# Patient Record
Sex: Female | Born: 1985
Health system: Southern US, Community
[De-identification: ages and names within clinical notes are randomized; demographics above are authoritative.]

## PROBLEM LIST (undated history)

## (undated) ENCOUNTER — Emergency Department (HOSPITAL_COMMUNITY): Payer: Medicaid Other | Source: Home / Self Care

## (undated) ENCOUNTER — Inpatient Hospital Stay (HOSPITAL_COMMUNITY): Payer: Self-pay

## (undated) DIAGNOSIS — E119 Type 2 diabetes mellitus without complications: Secondary | ICD-10-CM

## (undated) DIAGNOSIS — I8390 Asymptomatic varicose veins of unspecified lower extremity: Secondary | ICD-10-CM

## (undated) DIAGNOSIS — Z789 Other specified health status: Secondary | ICD-10-CM

## (undated) DIAGNOSIS — F419 Anxiety disorder, unspecified: Secondary | ICD-10-CM

---

## 2013-09-11 ENCOUNTER — Ambulatory Visit: Payer: Self-pay

## 2013-09-24 ENCOUNTER — Ambulatory Visit: Payer: Medicaid Other | Attending: Internal Medicine | Admitting: Internal Medicine

## 2013-09-24 ENCOUNTER — Encounter: Payer: Self-pay | Admitting: Internal Medicine

## 2013-09-24 VITALS — BP 109/68 | HR 67 | Temp 99.0°F | Resp 16 | Ht 63.0 in | Wt 175.0 lb

## 2013-09-24 DIAGNOSIS — M5380 Other specified dorsopathies, site unspecified: Secondary | ICD-10-CM

## 2013-09-24 DIAGNOSIS — Z139 Encounter for screening, unspecified: Secondary | ICD-10-CM

## 2013-09-24 DIAGNOSIS — M62830 Muscle spasm of back: Secondary | ICD-10-CM

## 2013-09-24 DIAGNOSIS — M549 Dorsalgia, unspecified: Secondary | ICD-10-CM | POA: Insufficient documentation

## 2013-09-24 DIAGNOSIS — R1032 Left lower quadrant pain: Secondary | ICD-10-CM

## 2013-09-24 LAB — COMPLETE METABOLIC PANEL WITH GFR
ALT: 10 U/L (ref 0–35)
AST: 14 U/L (ref 0–37)
Albumin: 4.3 g/dL (ref 3.5–5.2)
Alkaline Phosphatase: 83 U/L (ref 39–117)
BUN: 14 mg/dL (ref 6–23)
GFR, Est Non African American: >89 mL/min
Glucose, Bld: 90 mg/dL (ref 70–99)
Sodium: 137 mEq/L (ref 135–145)
Total Bilirubin: 0.4 mg/dL (ref 0.3–1.2)
Total Protein: 6.9 g/dL (ref 6.0–8.3)

## 2013-09-24 LAB — CBC WITH DIFFERENTIAL/PLATELET
Basophils Absolute: 0 10*3/uL (ref 0.0–0.1)
Basophils Relative: 0 % (ref 0–1)
Eosinophils Absolute: 0.1 10*3/uL (ref 0.0–0.7)
Hemoglobin: 12.9 g/dL (ref 12.0–15.0)
MCH: 29.1 pg (ref 26.0–34.0)
MCHC: 33.3 g/dL (ref 30.0–36.0)
MCV: 87.4 fL (ref 78.0–100.0)
Monocytes Relative: 5 % (ref 3–12)
Neutrophils Relative %: 41 % — ABNORMAL LOW (ref 43–77)
Platelets: 233 10*3/uL (ref 150–400)
RBC: 4.43 MIL/uL (ref 3.87–5.11)

## 2013-09-24 MED ORDER — CYCLOBENZAPRINE HCL 10 MG PO TABS: 10.0000 mg | ORAL_TABLET | Freq: Every day | ORAL | Status: DC

## 2013-09-24 MED ORDER — NAPROXEN 500 MG PO TABS: 500.0000 mg | ORAL_TABLET | Freq: Two times a day (BID) | ORAL | Status: DC

## 2013-09-24 NOTE — Progress Notes (Signed)
Pt is here to establish care. Pt reports that for over a year she has had severe pain in her legs, back and kidneys.  Pt needed the translator phone today.

## 2013-09-24 NOTE — Progress Notes (Signed)
MRN: [id number] Name: Penny Barber  Sex: female Age: 27 y.o. DOB: 03/07/1986  Allergies: Review of patient's allergies indicates no known allergies.  Chief Complaint  Patient presents with  . Establish Care    HPI: Patient is 27 y.o. female who comes for the first time to establish medical care patient reported to have left lower back pain for the last one year she has not taken any medication denies any fever chills, she had fallen several years ago, she denies any urinary symptoms, patient also reported to have left lower abdominal pain denies any nausea vomiting any change in bowel habits.  No past medical history on file.  No past surgical history on file.    Medication List       This list is accurate as of: 09/24/13  5:19 PM.  Always use your most recent med list.               cyclobenzaprine 10 MG tablet  Commonly known as:  FLEXERIL  Take 1 tablet (10 mg total) by mouth at bedtime.     naproxen 500 MG tablet  Commonly known as:  NAPROSYN  Take 1 tablet (500 mg total) by mouth 2 (two) times daily with a meal.        Meds ordered this encounter  Medications  . cyclobenzaprine (FLEXERIL) 10 MG tablet    Sig: Take 1 tablet (10 mg total) by mouth at bedtime.    Dispense:  30 tablet    Refill:  1  . naproxen (NAPROSYN) 500 MG tablet    Sig: Take 1 tablet (500 mg total) by mouth 2 (two) times daily with a meal.    Dispense:  30 tablet    Refill:  2     There is no immunization history on file for this patient.  No family history on file.  History  Substance Use Topics  . Smoking status: Never Smoker   . Smokeless tobacco: Not on file  . Alcohol Use: No    Review of Systems  As noted in HPI  Filed Vitals:   09/24/13 1628  BP: 109/68  Pulse: 67  Temp: 99 F (37.2 C)  Resp: 16    Physical Exam  Physical Exam  Constitutional: No distress.  Eyes: EOM are normal. Pupils are equal, round, and reactive to light.  Cardiovascular: Normal rate  and regular rhythm.   Pulmonary/Chest: Breath sounds normal. No respiratory distress. She has no wheezes.  Abdominal: Bowel sounds are normal.  Left lower quadrant tenderness no rebound or guarding   Musculoskeletal:  Lower lumbar spinal and left paraspinal tenderness, with SLR test pt complains of muscle pull in the back no shooting pain down to the leg,  Equal strength both lower extremities     CBC No results found for this basename: wbc,  rbc,  hgb,  hct,  plt,  mcv,  neutrabs,  lymphsabs,  monoabs,  eosabs,  basosabs    CMP  No results found for this basename: na,  k,  cl,  co2,  glucose,  bun,  creatinine,  calcium,  prot,  albumin,  ast,  alt,  alkphos,  bilitot,  gfrnonaa,  gfraa    No results found for this basename: chol,  tri,  ldl    No components found with this basename: hga1c    No results found for this basename: AST    Assessment and Plan  Back pain - Plan: naproxen (NAPROSYN) 500 MG tablet  Back muscle  spasm - Plan: cyclobenzaprine (FLEXERIL) 10 MG tablet each bedtime, also apply heating pad.  Abdominal pain, left lower quadrant - Plan: US Abdomen Complete  Screening - Plan: CBC with Differential, COMPLETE METABOLIC PANEL WITH GFR, TSH, Vit D  25 hydroxy (rtn osteoporosis monitoring)   Return in about 6 weeks (around 11/05/2013).  Doris Cheadle, MD

## 2013-09-25 LAB — VITAMIN D 25 HYDROXY (VIT D DEFICIENCY, FRACTURES): Vit D, 25-Hydroxy: 16 ng/mL — ABNORMAL LOW (ref 30–89)

## 2013-09-25 LAB — TSH: TSH: 1.64 u[IU]/mL (ref 0.350–4.500)

## 2013-10-07 ENCOUNTER — Ambulatory Visit: Payer: Self-pay

## 2013-10-08 ENCOUNTER — Ambulatory Visit: Payer: Medicaid Other | Attending: Internal Medicine | Admitting: Internal Medicine

## 2013-10-08 ENCOUNTER — Encounter: Payer: Self-pay | Admitting: Internal Medicine

## 2013-10-08 VITALS — BP 109/71 | HR 83 | Temp 98.5°F | Resp 16 | Ht 64.0 in | Wt 179.0 lb

## 2013-10-08 DIAGNOSIS — R079 Chest pain, unspecified: Secondary | ICD-10-CM | POA: Insufficient documentation

## 2013-10-08 LAB — TROPONIN I: Troponin I: <0.01 ng/mL

## 2013-10-08 LAB — CBC WITH DIFFERENTIAL/PLATELET
Basophils Absolute: 0 10*3/uL (ref 0.0–0.1)
Basophils Relative: 1 % (ref 0–1)
Eosinophils Absolute: 0.1 10*3/uL (ref 0.0–0.7)
Eosinophils Relative: 2 % (ref 0–5)
HCT: 37.5 % (ref 36.0–46.0)
Hemoglobin: 12.8 g/dL (ref 12.0–15.0)
Lymphocytes Relative: 41 % (ref 12–46)
Lymphs Abs: 1.6 10*3/uL (ref 0.7–4.0)
MCH: 29.3 pg (ref 26.0–34.0)
MCHC: 34.1 g/dL (ref 30.0–36.0)
MCV: 85.8 fL (ref 78.0–100.0)
Monocytes Absolute: 0.2 10*3/uL (ref 0.1–1.0)
Monocytes Relative: 6 % (ref 3–12)
Neutro Abs: 1.9 10*3/uL (ref 1.7–7.7)
Neutrophils Relative %: 50 % (ref 43–77)
Platelets: 200 10*3/uL (ref 150–400)
RBC: 4.37 MIL/uL (ref 3.87–5.11)
RDW: 13.5 % (ref 11.5–15.5)
WBC: 3.7 10*3/uL — ABNORMAL LOW (ref 4.0–10.5)

## 2013-10-08 LAB — TSH: TSH: 1.903 u[IU]/mL (ref 0.350–4.500)

## 2013-10-08 LAB — COMPREHENSIVE METABOLIC PANEL WITH GFR
ALT: 8 U/L (ref 0–35)
AST: 14 U/L (ref 0–37)
Albumin: 4 g/dL (ref 3.5–5.2)
Alkaline Phosphatase: 82 U/L (ref 39–117)
BUN: 15 mg/dL (ref 6–23)
CO2: 23 meq/L (ref 19–32)
Calcium: 8.5 mg/dL (ref 8.4–10.5)
Chloride: 105 meq/L (ref 96–112)
Creat: 0.57 mg/dL (ref 0.50–1.10)
Glucose, Bld: 102 mg/dL — ABNORMAL HIGH (ref 70–99)
Potassium: 4 meq/L (ref 3.5–5.3)
Sodium: 137 meq/L (ref 135–145)
Total Bilirubin: 0.6 mg/dL (ref 0.3–1.2)
Total Protein: 7 g/dL (ref 6.0–8.3)

## 2013-10-08 LAB — LIPID PANEL
Cholesterol: 174 mg/dL (ref 0–200)
HDL: 61 mg/dL
LDL Cholesterol: 96 mg/dL (ref 0–99)
Total CHOL/HDL Ratio: 2.9 ratio
Triglycerides: 87 mg/dL
VLDL: 17 mg/dL (ref 0–40)

## 2013-10-08 NOTE — Progress Notes (Signed)
Pt reports having chest pain. Pt is requesting lab and x ray results.

## 2013-10-08 NOTE — Progress Notes (Signed)
Patient ID: Penny Barber, female   DOB: 1986-08-30, 27 y.o.   MRN: [id number]   CC: Chest pain  HPI: Patient is 27 year old female who presents to clinic with main concern of intermittent episodes of substernal chest discomfort associated with shortness of breath. Patient explains these episodes are sudden and usually come at the time of stress. She explains when these episodes occur its lasting anywhere from 10-15 minutes and spontaneously resolve. She does not report any radiating symptoms, pain is rated as 3/10 in severity when present, no specific alleviating or aggravating factors. She denies any family of heart problems, no sudden cardiac death in the family. She denies fevers and chills, no cough, no orthopnea, no similar events in the past. She explains she immigrated to the [place] 4 months ago and was in a refugee [place] where these symptoms started. Please note the interpreter was used during the interview  No Known Allergies History reviewed. No pertinent past medical history. No current outpatient prescriptions on file prior to visit.   No current facility-administered medications on file prior to visit.   History reviewed. No pertinent family history. History   Social History  . Marital Status: Married    Spouse Name: N/A    Number of Children: N/A  . Years of Education: N/A   Occupational History  . Not on file.   Social History Main Topics  . Smoking status: Never Smoker   . Smokeless tobacco: Not on file  . Alcohol Use: No  . Drug Use: No  . Sexual Activity: Yes   Other Topics Concern  . Not on file   Social History Narrative  . No narrative on file    Review of Systems  Constitutional: Negative for fever, chills, diaphoresis, activity change, appetite change and fatigue.  HENT: Negative for ear pain, nosebleeds, congestion, facial swelling, rhinorrhea, neck pain, neck stiffness and ear discharge.   Eyes: Negative for pain, discharge, redness, itching  and visual disturbance.  Respiratory: Negative for cough, choking, chest tightness, shortness of breath, wheezing and stridor.   Cardiovascular: Negative for chest pain, palpitations and leg swelling.  Gastrointestinal: Negative for abdominal distention.  Genitourinary: Negative for dysuria, urgency, frequency, hematuria, flank pain, decreased urine volume, difficulty urinating and dyspareunia.  Musculoskeletal: Negative for back pain, joint swelling, arthralgias and gait problem.  Neurological: Negative for dizziness, tremors, seizures, syncope, facial asymmetry, speech difficulty, weakness, light-headedness, numbness and headaches.  Hematological: Negative for adenopathy. Does not bruise/bleed easily.  Psychiatric/Behavioral: Negative for hallucinations, behavioral problems, confusion, dysphoric mood, decreased concentration and agitation.    Objective:   Filed Vitals:   10/08/13 1056  BP: 109/71  Pulse: 83  Temp: 98.5 F (36.9 C)  Resp: 16    Physical Exam  Constitutional: Appears well-developed and well-nourished. No distress.  HENT: Normocephalic. External right and left ear normal. Oropharynx is clear and moist.  Eyes: Conjunctivae and EOM are normal. PERRLA, no scleral icterus.  Neck: Normal ROM. Neck supple. No JVD. No tracheal deviation. No thyromegaly.  CVS: RRR, S1/S2 +, no murmurs, no gallops, no carotid bruit.  Pulmonary: Effort and breath sounds normal, no stridor, rhonchi, wheezes, rales.  Abdominal: Soft. BS +,  no distension, tenderness, rebound or guarding.  Musculoskeletal: Normal range of motion. No edema and no tenderness.  Lymphadenopathy: No lymphadenopathy noted, cervical, inguinal. Neuro: Alert. Normal reflexes, muscle tone coordination. No cranial nerve deficit. Skin: Skin is warm and dry. No rash noted. Not diaphoretic. No erythema. No pallor.  Psychiatric: Normal mood  and affect. Behavior, judgment, thought content normal.   No results found for this  basename: WBC, HGB, HCT, MCV, PLT   No results found for this basename: CREATININE, BUN, NA, K, CL, CO2    No results found for this basename: HGBA1C   Lipid Panel  No results found for this basename: chol, trig, hdl, cholhdl, vldl, ldlcalc       Assessment and plan:   Chest pain - this is likely related to anxiety attacks. We have discussed methods of relaxation. We will check blood tests on today's visit including troponin and electrolyte panel. We'll also check TSH and will call patient with results. Please note that her heart and lung exams are both stable and within normal limits.

## 2013-10-10 ENCOUNTER — Encounter: Payer: Self-pay | Admitting: Internal Medicine

## 2013-11-05 ENCOUNTER — Encounter: Payer: Self-pay | Admitting: Internal Medicine

## 2013-11-05 ENCOUNTER — Ambulatory Visit: Payer: Medicaid Other | Attending: Internal Medicine | Admitting: Internal Medicine

## 2013-11-05 VITALS — BP 110/73 | HR 80 | Temp 98.1°F | Resp 16 | Ht 64.0 in | Wt 178.0 lb

## 2013-11-05 DIAGNOSIS — M5380 Other specified dorsopathies, site unspecified: Secondary | ICD-10-CM

## 2013-11-05 DIAGNOSIS — M549 Dorsalgia, unspecified: Secondary | ICD-10-CM

## 2013-11-05 DIAGNOSIS — R109 Unspecified abdominal pain: Secondary | ICD-10-CM

## 2013-11-05 DIAGNOSIS — S335XXA Sprain of ligaments of lumbar spine, initial encounter: Secondary | ICD-10-CM | POA: Insufficient documentation

## 2013-11-05 DIAGNOSIS — M62830 Muscle spasm of back: Secondary | ICD-10-CM

## 2013-11-05 DIAGNOSIS — X58XXXA Exposure to other specified factors, initial encounter: Secondary | ICD-10-CM | POA: Insufficient documentation

## 2013-11-05 LAB — POCT URINALYSIS DIPSTICK
Bilirubin, UA: NEGATIVE
GLUCOSE UA: NEGATIVE
KETONES UA: NEGATIVE
Leukocytes, UA: NEGATIVE
Nitrite, UA: NEGATIVE
PH UA: 6
Protein, UA: NEGATIVE
RBC UA: NEGATIVE
Spec Grav, UA: >=1.03
Urobilinogen, UA: 0.2

## 2013-11-05 LAB — POCT URINE PREGNANCY: Preg Test, Ur: NEGATIVE

## 2013-11-05 MED ORDER — NAPROXEN 500 MG PO TABS: 500.0000 mg | ORAL_TABLET | Freq: Two times a day (BID) | ORAL | Status: DC

## 2013-11-05 MED ORDER — ERGOCALCIFEROL 1.25 MG (50000 UT) PO CAPS: 50000.0000 [IU] | ORAL_CAPSULE | ORAL | Status: DC

## 2013-11-05 MED ORDER — CYCLOBENZAPRINE HCL 10 MG PO TABS: 10.0000 mg | ORAL_TABLET | Freq: Every day | ORAL | Status: DC

## 2013-11-05 NOTE — Progress Notes (Signed)
Pt is here with C.C. Of left flank pain. Pt is requesting to review her lab results.

## 2013-11-05 NOTE — Progress Notes (Signed)
Patient ID: Penny Barber, female   DOB: [birth year], 28 y.o.   MRN: [id number]   CC:  HPI: 28 year old female with a history of left flank pain, low back pain for which he is taking Flexeril and Naprosyn in the past, here for a followup. The back pain has returned because the patient has run out of her medications. She recently moved to the [place] 4 months ago. She has not had any imaging studies. She also requests results of her blood work, notified of the patient's vitamin D level is low She denies any stool or urinary incontinence any numbness or tingling in her legs She is due for a pelvic ultrasound machine which is scheduled for February  No Known Allergies History reviewed. No pertinent past medical history. No current outpatient prescriptions on file prior to visit.   No current facility-administered medications on file prior to visit.   History reviewed. No pertinent family history. History   Social History  . Marital Status: Married    Spouse Name: N/A    Number of Children: N/A  . Years of Education: N/A   Occupational History  . Not on file.   Social History Main Topics  . Smoking status: Never Smoker   . Smokeless tobacco: Not on file  . Alcohol Use: No  . Drug Use: No  . Sexual Activity: Yes   Other Topics Concern  . Not on file   Social History Narrative   ** Merged History Encounter **        Review of Systems  Constitutional: Negative for fever, chills, diaphoresis, activity change, appetite change and fatigue.  HENT: Negative for ear pain, nosebleeds, congestion, facial swelling, rhinorrhea, neck pain, neck stiffness and ear discharge.   Eyes: Negative for pain, discharge, redness, itching and visual disturbance.  Respiratory: Negative for cough, choking, chest tightness, shortness of breath, wheezing and stridor.   Cardiovascular: Negative for chest pain, palpitations and leg swelling.  Gastrointestinal: Negative for abdominal distention.   Genitourinary: Negative for dysuria, urgency, frequency, hematuria, flank pain, decreased urine volume, difficulty urinating and dyspareunia.  Musculoskeletal: Negative for back pain, joint swelling, arthralgias and gait problem.  Neurological: Negative for dizziness, tremors, seizures, syncope, facial asymmetry, speech difficulty, weakness, light-headedness, numbness and headaches.  Hematological: Negative for adenopathy. Does not bruise/bleed easily.  Psychiatric/Behavioral: Negative for hallucinations, behavioral problems, confusion, dysphoric mood, decreased concentration and agitation.    Objective:   Filed Vitals:   11/05/13 1528  BP: 110/73  Pulse: 80  Temp: 98.1 F (36.7 C)  Resp: 16    Physical Exam  Constitutional: Appears well-developed and well-nourished. No distress.  HENT: Normocephalic. External right and left ear normal. Oropharynx is clear and moist.  Eyes: Conjunctivae and EOM are normal. PERRLA, no scleral icterus.  Neck: Normal ROM. Neck supple. No JVD. No tracheal deviation. No thyromegaly.  CVS: RRR, S1/S2 +, no murmurs, no gallops, no carotid bruit.  Pulmonary: Effort and breath sounds normal, no stridor, rhonchi, wheezes, rales.  Abdominal: Soft. BS +,  no distension, tenderness, rebound or guarding.  Musculoskeletal: Normal range of motion. No edema and positive for tenderness lumbar spine.  Lymphadenopathy: No lymphadenopathy noted, cervical, inguinal. Neuro: Alert. Normal reflexes, muscle tone coordination. No cranial nerve deficit. Skin: Skin is warm and dry. No rash noted. Not diaphoretic. No erythema. No pallor.  Psychiatric: Normal mood and affect. Behavior, judgment, thought content normal.   Lab Results  Component Value Date   WBC 3.7* 10/08/2013   HGB 12.8 10/08/2013  HCT 37.5 10/08/2013   MCV 85.8 10/08/2013   PLT 200 10/08/2013   Lab Results  Component Value Date   CREATININE 0.57 10/08/2013   BUN 15 10/08/2013   NA 137 10/08/2013    K 4.0 10/08/2013   CL 105 10/08/2013   CO2 23 10/08/2013    No results found for this basename: HGBA1C   Lipid Panel     Component Value Date/Time   CHOL 174 10/08/2013 1142   TRIG 87 10/08/2013 1142   HDL 61 10/08/2013 1142   CHOLHDL 2.9 10/08/2013 1142   VLDL 17 10/08/2013 1142   LDLCALC 96 10/08/2013 1142       Assessment and plan:   Patient Active Problem List   Diagnosis Date Noted  . Back pain 09/24/2013  . Abdominal pain, left lower quadrant 09/24/2013     Lumbar sprain Refill Flexeril/Naprosyn Plain x-rays Pelvic ultrasound pending   Follow up in 2 months   Vitamin D deficiency Vitamin D 50,000 units, repeat levels in 2 months   The patient was given clear instructions to go to ER or return to medical center if symptoms don't improve, worsen or new problems develop. The patient verbalized understanding. The patient was told to call to get any lab results if not heard anything in the next week.

## 2013-11-06 ENCOUNTER — Ambulatory Visit (HOSPITAL_COMMUNITY)
Admission: RE | Admit: 2013-11-06 | Discharge: 2013-11-06 | Disposition: A | Discharge status: Home / Self Care | Payer: Medicaid Other | Source: Ambulatory Visit | Attending: Internal Medicine | Admitting: Internal Medicine

## 2013-11-06 DIAGNOSIS — R109 Unspecified abdominal pain: Secondary | ICD-10-CM

## 2013-11-06 DIAGNOSIS — M62830 Muscle spasm of back: Secondary | ICD-10-CM

## 2013-11-06 DIAGNOSIS — M549 Dorsalgia, unspecified: Secondary | ICD-10-CM | POA: Insufficient documentation

## 2013-11-06 DIAGNOSIS — R10A0 Flank pain, unspecified side: Secondary | ICD-10-CM

## 2013-11-07 ENCOUNTER — Telehealth: Payer: Self-pay | Admitting: *Deleted

## 2013-11-07 NOTE — Telephone Encounter (Signed)
Contacted pt with the interpreter line. Notified pt of a negative x-ray of the lumbar spine.

## 2013-11-07 NOTE — Telephone Encounter (Signed)
Message copied by Sindi Beckworth, [place] on Fri Nov 07, 2013  2:04 PM ------      Message from: Susie CassetteABROL MD, [facility]      Created: Thu Nov 06, 2013  2:01 PM       Negative x-ray of the sacrum or coccyx ------

## 2013-11-07 NOTE — Telephone Encounter (Signed)
Message copied by Darcee Dekker, [place] on Fri Nov 07, 2013 10:14 AM ------      Message from: Susie CassetteABROL MD, [facility]      Created: Thu Nov 06, 2013 11:05 AM       Urinalysis and pregnancy test is negative ------

## 2013-11-25 ENCOUNTER — Encounter (HOSPITAL_COMMUNITY): Payer: Self-pay | Admitting: Emergency Medicine

## 2013-11-25 ENCOUNTER — Emergency Department (HOSPITAL_COMMUNITY)
Admission: EM | Admit: 2013-11-25 | Discharge: 2013-11-26 | Disposition: A | Discharge status: Home / Self Care | Payer: Medicaid Other | Attending: Emergency Medicine | Admitting: Emergency Medicine

## 2013-11-25 DIAGNOSIS — R21 Rash and other nonspecific skin eruption: Secondary | ICD-10-CM | POA: Insufficient documentation

## 2013-11-25 DIAGNOSIS — N61 Mastitis without abscess: Secondary | ICD-10-CM | POA: Insufficient documentation

## 2013-11-25 LAB — CBC WITH DIFFERENTIAL/PLATELET
BASOS PCT: 0 % (ref 0–1)
Basophils Absolute: 0 10*3/uL (ref 0.0–0.1)
EOS ABS: 0 10*3/uL (ref 0.0–0.7)
Eosinophils Relative: 1 % (ref 0–5)
HCT: 39.8 % (ref 36.0–46.0)
HEMOGLOBIN: 13.7 g/dL (ref 12.0–15.0)
LYMPHS ABS: 2.2 10*3/uL (ref 0.7–4.0)
Lymphocytes Relative: 30 % (ref 12–46)
MCH: 30.4 pg (ref 26.0–34.0)
MCHC: 34.4 g/dL (ref 30.0–36.0)
MCV: 88.4 fL (ref 78.0–100.0)
Monocytes Absolute: 0.4 10*3/uL (ref 0.1–1.0)
Monocytes Relative: 5 % (ref 3–12)
Neutro Abs: 4.9 10*3/uL (ref 1.7–7.7)
Neutrophils Relative %: 65 % (ref 43–77)
PLATELETS: 239 10*3/uL (ref 150–400)
RBC: 4.5 MIL/uL (ref 3.87–5.11)
RDW: 11.9 % (ref 11.5–15.5)
WBC: 7.5 10*3/uL (ref 4.0–10.5)

## 2013-11-25 LAB — COMPREHENSIVE METABOLIC PANEL
ALK PHOS: 88 U/L (ref 39–117)
ALT: 12 U/L (ref 0–35)
AST: 18 U/L (ref 0–37)
Albumin: 4.1 g/dL (ref 3.5–5.2)
BILIRUBIN TOTAL: 0.3 mg/dL (ref 0.3–1.2)
BUN: 17 mg/dL (ref 6–23)
CHLORIDE: 102 meq/L (ref 96–112)
CO2: 25 mEq/L (ref 19–32)
Calcium: 9.2 mg/dL (ref 8.4–10.5)
Creatinine, Ser: 0.64 mg/dL (ref 0.50–1.10)
GFR calc Af Amer: >90 mL/min (ref >90)
GFR calc non Af Amer: >90 mL/min (ref >90)
Glucose, Bld: 121 mg/dL — ABNORMAL HIGH (ref 70–99)
Potassium: 3.6 mEq/L — ABNORMAL LOW (ref 3.7–5.3)
SODIUM: 141 meq/L (ref 137–147)
TOTAL PROTEIN: 8.1 g/dL (ref 6.0–8.3)

## 2013-11-25 LAB — POCT I-STAT TROPONIN I: Troponin i, poc: 0 ng/mL (ref 0.00–0.08)

## 2013-11-25 MED ORDER — SULFAMETHOXAZOLE-TMP DS 800-160 MG PO TABS
1.0000 | ORAL_TABLET | Freq: Once | ORAL | Status: AC
  Administered 2013-11-25: 1 via ORAL
  Filled 2013-11-25: qty 1

## 2013-11-25 MED ORDER — SODIUM CHLORIDE 0.9 % IV BOLUS (SEPSIS)
1000.0000 mL | Freq: Once | INTRAVENOUS | Status: AC
  Administered 2013-11-25: 1000 mL via INTRAVENOUS

## 2013-11-25 MED ORDER — IBUPROFEN 800 MG PO TABS
800.0000 mg | ORAL_TABLET | Freq: Once | ORAL | Status: AC
  Administered 2013-11-25: 800 mg via ORAL
  Filled 2013-11-25: qty 1

## 2013-11-25 NOTE — ED Provider Notes (Signed)
CSN: [id number]     Arrival date & time 11/25/13  1910 History   First MD Initiated Contact with Patient 11/25/13 1929     Chief Complaint  Patient presents with  . Chest Pain      HPI  28 yo F from [place] who is breastfeeding her 79 month old. She develops today with mild redness and ttp of her R breast. She denies any new drainage from the breast. No reported fevers. No trauma, CP, RUQ pain, emesis. Pain is specifically located to her R breast. Pain does not radiate. It is a 4/10 and worsens with palpation.   History reviewed. No pertinent past medical history. Past Surgical History  Procedure Laterality Date  . Cesarean section     History reviewed. No pertinent family history. History  Substance Use Topics  . Smoking status: Never Smoker   . Smokeless tobacco: Not on file  . Alcohol Use: No   OB History   Grav Para Term Preterm Abortions TAB SAB Ect Mult Living                 Review of Systems  Constitutional: Negative for fever, chills, activity change and appetite change.  HENT: Negative for congestion, ear pain and rhinorrhea.   Eyes: Negative for pain.  Respiratory: Negative for cough and shortness of breath.   Cardiovascular: Negative for chest pain and palpitations.  Gastrointestinal: Negative for nausea, vomiting and abdominal pain.  Genitourinary: Negative for dysuria, difficulty urinating and pelvic pain.  Musculoskeletal: Negative for back pain and neck pain.  Skin: Positive for rash. Negative for wound.  Neurological: Negative for weakness and headaches.  Psychiatric/Behavioral: Negative for behavioral problems, confusion and agitation.      Allergies  Review of patient's allergies indicates no known allergies.  Home Medications   No current outpatient prescriptions on file. BP 104/60  Pulse 96  Temp(Src) 98.4 F (36.9 C) (Oral)  Resp 18  Ht 5\' 5"  (1.651 m)  Wt 179 lb (81.194 kg)  BMI 29.79 kg/m2  SpO2 98% Physical Exam  Constitutional:  She is oriented to person, place, and time. She appears well-developed and well-nourished. No distress.  HENT:  Head: Normocephalic and atraumatic.  Nose: Nose normal.  Mouth/Throat: Oropharynx is clear and moist.  Eyes: EOM are normal. Pupils are equal, round, and reactive to light.  Neck: Normal range of motion. Neck supple. No tracheal deviation present.  Cardiovascular: Normal rate, regular rhythm, normal heart sounds and intact distal pulses.   Pulmonary/Chest: Effort normal and breath sounds normal. She has no rales.  Abdominal: Soft. Bowel sounds are normal. She exhibits no distension. There is no tenderness. There is no rebound and no guarding.  Musculoskeletal: Normal range of motion. She exhibits no tenderness.  Neurological: She is alert and oriented to person, place, and time.  Skin: Skin is warm and dry. Rash noted.  Breast area with no fluctuance, no drainage. Mild erythema and TTP on exam. Bedside US without obvious collection.  Some ducts appear engorged.   Psychiatric: She has a normal mood and affect. Her behavior is normal.    ED Course  Procedures (including critical care time) Labs Review Labs Reviewed  COMPREHENSIVE METABOLIC PANEL - Abnormal; Notable for the following:    Potassium 3.6 (*)    Glucose, Bld 121 (*)    All other components within normal limits  CBC WITH DIFFERENTIAL  POCT I-STAT TROPONIN I   Imaging Review No results found.  EKG Interpretation   None  MDM   Final diagnoses:  Mastitis, acute    28 yo F in NAD AFVSS non toxic appearing. Presents with concern for mastitis. No fluctuance doubt abscess. Interpreter used for ED encounter. Will start on bactrim. Explained that she should avoid breastfeeding while on the bactrim but should express her breast milk during this time. Thorough discussion with patient on plan, findings and return  Precautions. F/u with pcp in 2 days or sooner in ED if worsening or any concerns.   12:24 AM Pt  developed nausea after bactrim. Will change to Augmentin. Explained there is a chance this will not treat the infection if it is MRSA. Used interpreter to explain she must not breast feed during abx course. Will follow up with PCP tomorrow.   Case discussed with Dr. Jodi MourningZavitz.      Nadara MustardJulio Cassidie Veiga, MD 11/26/13 [phone]

## 2013-11-25 NOTE — ED Notes (Addendum)
Pt states that her pain can be pinpointed to her right breast. Pt also states that the pain radiates through her right arm, into her right side abdomen and in her shoulders. Pt states that pain started today. Pt denies doing anything when the pain started.

## 2013-11-25 NOTE — ED Notes (Signed)
Assisted resident with examination, asked to placed a hot pack onto patients right breast. Hot pack placed on pt's right breast.

## 2013-11-26 MED ORDER — SULFAMETHOXAZOLE-TMP DS 800-160 MG PO TABS: 1.0000 | ORAL_TABLET | Freq: Two times a day (BID) | ORAL | Status: DC

## 2013-11-26 MED ORDER — AMOXICILLIN-POT CLAVULANATE 875-125 MG PO TABS: 1.0000 | ORAL_TABLET | Freq: Two times a day (BID) | ORAL | Status: DC

## 2013-11-26 NOTE — ED Notes (Signed)
Dr. Roda ShuttersArriola ( resident MD ) at bedside explaining discharge instructions to pt. using interpreter ( [place]) phone . Prescription for mastitis given to pt.

## 2013-11-26 NOTE — ED Provider Notes (Addendum)
Medical screening examination/treatment/procedure(s) were conducted as a shared visit with non-physician practitioner(s) or resident and myself. I personally evaluated the patient during the encounter and agree with the findings and plan unless otherwise indicated.  I have personally reviewed any xrays and/ or EKG's with the provider and I agree with interpretation.  Breast feeding, right mid breast tenderness. On exam no fluctuance, tender, mild warmth, no drainage.  Well appearing otherwise. Concern for mastitis. Plan for abx and close fup outpt. Abdo soft/ NT.  Mastitis, Right breast pain  EMERGENCY DEPARTMENT US SOFT TISSUE INTERPRETATION "Study: Limited Soft Tissue Ultrasound"  INDICATIONS: Pain and Soft tissue infection Multiple views of the body part were obtained in real-time with a multi-frequency linear probe PERFORMED BY:  Myself IMAGES ARCHIVED?: Yes SIDE:Right  BODY PART:Breast FINDINGS: No abcess noted INTERPRETATION:  No abcess noted     Enid SkeensJoshua M Cason Luffman, MD 11/26/13 0230  Enid SkeensJoshua M Dolores Ewing, MD 12/04/13 [phone]

## 2013-11-26 NOTE — Discharge Instructions (Signed)
Breastfeeding and Mastitis °Mastitis is inflammation of the breast tissue. It can occur in women who are breastfeeding. This can make breastfeeding painful. Mastitis will sometimes go away on its own. Your health care provider will help determine if treatment is needed. °CAUSES °Mastitis is often associated with a blocked milk (lactiferous) duct. This can happen when too much milk builds up in the breast. Causes of excess milk in the breast can include: °· Poor latch-on. If your baby is not latched onto the breast properly, she or he may not empty your breast completely while breastfeeding. °· Allowing too much time to pass between feedings. °· Wearing a bra or other clothing that is too tight. This puts extra pressure on the lactiferous ducts so milk does not flow through them as it should. °Mastitis can also be caused by a bacterial infection. Bacteria may enter the breast tissue through cuts or openings in the skin. In women who are breastfeeding, this may occur because of cracked or irritated skin. Cracks in the skin are often caused when your baby does not latch on properly to the breast. °SIGNS AND SYMPTOMS °· Swelling, redness, tenderness, and pain in an area of the breast. °· Swelling of the glands under the arm on the same side. °· Fever may or may not accompany mastitis. °If an infection is allowed to progress, a collection of pus (abscess) may develop. °DIAGNOSIS  °Your health care provider can usually diagnose mastitis based on your symptoms and a physical exam. Tests may be done to help confirm the diagnosis. These may include: °· Removal of pus from the breast by applying pressure to the area. This pus can be examined in the lab to determine which bacteria are present. If an abscess has developed, the fluid in the abscess can be removed with a needle. This can also be used to confirm the diagnosis and determine the bacteria present. In most cases, pus will not be present. °· Blood tests to determine if  your body is fighting a bacterial infection. °· Mammogram or ultrasound tests to rule out other problems or diseases. °TREATMENT  °Mastitis that occurs with breastfeeding will sometimes go away on its own. Your health care provider may choose to wait 24 hours after first seeing you to decide whether a prescription medicine is needed. If your symptoms are worse after 24 hours, your health care provider will likely prescribe an antibiotic to treat the mastitis. He or she will determine which bacteria are most likely causing the infection and will then select an appropriate antibiotic. This is sometimes changed based on the results of tests performed to identify the bacteria, or if there is no response to the antibiotic selected. Antibiotics are usually given by mouth. You may also be given medicine for pain. °HOME CARE INSTRUCTIONS °· Only take over-the-counter or prescription medicines for pain, fever, or discomfort as directed by your health care provider. °· If your health care provider prescribed an antibiotic, take the medicine as directed. Make sure you finish it even if you start to feel better. °· Do not wear a tight or underwire bra. Wear a soft, supportive bra. °· Increase your fluid intake, especially if you have a fever. °· Continue to empty the breast. Your health care provider can tell you whether this milk is safe for your infant or needs to be thrown out. You may be told to stop nursing until your health care provider thinks it is safe for your baby. Use a breast pump if   you are advised to stop nursing.  Keep your nipples clean and dry.  Empty the first breast completely before going to the other breast. If your baby is not emptying your breasts completely for some reason, use a breast pump to empty your breasts.  If you go back to work, pump your breasts while at work to stay in time with your nursing schedule.  Avoid allowing your breasts to become overly filled with milk (engorged). SEEK  MEDICAL CARE IF:  You have pus-like discharge from the breast.  Your symptoms do not improve with the treatment prescribed by your health care provider within 2 days. SEEK IMMEDIATE MEDICAL CARE IF:  Your pain and swelling are getting worse.  You have pain that is not controlled with medicine.  You have a red line extending from the breast toward your armpit.  You have a fever or persistent symptoms for more than 2 3 days.  You have a fever and your symptoms suddenly get worse. MAKE SURE YOU:   Understand these instructions.  Will watch your condition.  Will get help right away if you are not doing well or get worse. Document Released: 01/20/2005 Document Revised: 05/28/2013 Document Reviewed: 05/01/2013 [facility] Patient Information 2014 [place], [place].  Cellulitis Cellulitis is an infection of the skin and the tissue under the skin. The infected area is usually red and tender. This happens most often in the arms and lower legs. HOME CARE   Take your antibiotic medicine as told. Finish the medicine even if you start to feel better.  Keep the infected arm or leg raised (elevated).  Put a warm cloth on the area up to 4 times per day.  Only take medicines as told by your doctor.  Keep all doctor visits as told. GET HELP RIGHT AWAY IF:   You have a fever.  You feel very sleepy.  You throw up (vomit) or have watery poop (diarrhea).  You feel sick and have muscle aches and pains.  You see red streaks on the skin coming from the infected area.  Your red area gets bigger or turns a dark color.  Your bone or joint under the infected area is painful after the skin heals.  Your infection comes back in the same area or different area.  You have a puffy (swollen) bump in the infected area.  You have new symptoms. MAKE SURE YOU:   Understand these instructions.  Will watch your condition.  Will get help right away if you are not doing well or get worse. Document  Released: 03/13/2008 Document Revised: 03/26/2012 Document Reviewed: 12/11/2011 [facility]Exit[facility] Patient Information 2014 [place], [place].

## 2013-11-27 ENCOUNTER — Encounter (HOSPITAL_BASED_OUTPATIENT_CLINIC_OR_DEPARTMENT_OTHER): Payer: Self-pay | Admitting: Emergency Medicine

## 2013-11-27 ENCOUNTER — Emergency Department (HOSPITAL_BASED_OUTPATIENT_CLINIC_OR_DEPARTMENT_OTHER)
Admission: EM | Admit: 2013-11-27 | Discharge: 2013-11-27 | Disposition: A | Discharge status: Home / Self Care | Payer: Medicaid Other | Attending: Emergency Medicine | Admitting: Emergency Medicine

## 2013-11-27 DIAGNOSIS — Z792 Long term (current) use of antibiotics: Secondary | ICD-10-CM | POA: Insufficient documentation

## 2013-11-27 DIAGNOSIS — N61 Mastitis without abscess: Secondary | ICD-10-CM | POA: Insufficient documentation

## 2013-11-27 MED ORDER — ONDANSETRON HCL 4 MG/2ML IJ SOLN
4.0000 mg | Freq: Once | INTRAMUSCULAR | Status: AC
  Administered 2013-11-27: 4 mg via INTRAVENOUS
  Filled 2013-11-27: qty 2

## 2013-11-27 MED ORDER — CLINDAMYCIN PHOSPHATE 600 MG/50ML IV SOLN
600.0000 mg | Freq: Once | INTRAVENOUS | Status: AC
  Administered 2013-11-27: 600 mg via INTRAVENOUS
  Filled 2013-11-27: qty 50

## 2013-11-27 MED ORDER — KETOROLAC TROMETHAMINE 30 MG/ML IJ SOLN
30.0000 mg | Freq: Once | INTRAMUSCULAR | Status: AC
  Administered 2013-11-27: 30 mg via INTRAVENOUS
  Filled 2013-11-27: qty 1

## 2013-11-27 MED ORDER — SODIUM CHLORIDE 0.9 % IV SOLN
INTRAVENOUS | Status: DC
  Administered 2013-11-27: 1000 mL via INTRAVENOUS

## 2013-11-27 NOTE — ED Provider Notes (Signed)
CSN: [id number]     Arrival date & time 11/27/13  0007 History   First MD Initiated Contact with Patient 11/27/13 0101     Chief Complaint  Patient presents with  . Breast Pain     (Consider location/radiation/quality/duration/timing/severity/associated sxs/prior Treatment) HPI This is a 28 year old female who was evaluated on the 17th of this month for breast pain. She is currently nursing. She was diagnosed with right mastitis and placed initially on Bactrim but due to nausea this was switched to Augmentin. She returns complaining of generalized malaise, body aches, headache, low-grade fever and continued pain in the right breast. She has not been taking any acetaminophen or NSAIDs for her symptoms. She has had nausea but no diarrhea. She wishes to be tested for typhoid and malaria.  History reviewed. No pertinent past medical history. Past Surgical History  Procedure Laterality Date  . Cesarean section     No family history on file. History  Substance Use Topics  . Smoking status: Never Smoker   . Smokeless tobacco: Not on file  . Alcohol Use: No   OB History   Grav Para Term Preterm Abortions TAB SAB Ect Mult Living                 Review of Systems  All other systems reviewed and are negative.      Allergies  Review of patient's allergies indicates no known allergies.  Home Medications   Current Outpatient Rx  Name  Route  Sig  Dispense  Refill  . amoxicillin-clavulanate (AUGMENTIN) 875-125 MG per tablet   Oral   Take 1 tablet by mouth every 12 (twelve) hours.   14 tablet   0    BP 109/67  Pulse 104  Temp(Src) 99.5 F (37.5 C) (Oral)  Resp 18  SpO2 99%  Physical Exam General: Well-developed, well-nourished female in no acute distress; appearance consistent with age of record HENT: normocephalic; atraumatic Eyes: pupils equal, round and reactive to light; extraocular muscles intact Neck: supple Heart: regular rate and rhythm; tachycardia Lungs:  clear to auscultation bilaterally Chest: Erythema and tenderness of the right breast inferiorly and medial to the areola without fluctuance or induration Abdomen: soft; nondistended; nontender; no masses or hepatosplenomegaly; bowel sounds present Extremities: No deformity; full range of motion; pulses normal Neurologic: Awake, alert and oriented; motor function intact in all extremities and symmetric; no facial droop Skin: Warm and dry Psychiatric: Poor eye contact, angry affect     ED Course  Procedures (including critical care time)   MDM  1:12 AM Patient's symptomatology is consistent with typhoid. We will send malaria smear which will not be resulted in the ED but can be checked by the patient's primary care physician.  2:23 AM Patient given a dose of IV antibiotics and Toradol in the ED. She was encouraged to take Tylenol or ibuprofen as needed for body aches and fever. She was encouraged to follow up with her primary care physician as soon as possible.    Hanley SeamenJohn L Thane Age, MD 11/27/13 [phone]

## 2013-11-27 NOTE — ED Notes (Signed)
C/o breast pain since feb 17,  Was seen and treated for same,  mastitis

## 2013-11-27 NOTE — ED Notes (Signed)
aarrived ems w c/o breast pain x 3 days  Was seen on feb 17 and treated for mastitis

## 2013-11-28 LAB — MALARIA SMEAR

## 2013-12-01 ENCOUNTER — Ambulatory Visit: Payer: Medicaid Other | Attending: Internal Medicine | Admitting: Internal Medicine

## 2013-12-01 ENCOUNTER — Encounter: Payer: Self-pay | Admitting: Internal Medicine

## 2013-12-01 VITALS — BP 104/70 | HR 100 | Temp 98.1°F | Resp 17 | Wt 175.4 lb

## 2013-12-01 DIAGNOSIS — N61 Mastitis without abscess: Secondary | ICD-10-CM | POA: Insufficient documentation

## 2013-12-01 DIAGNOSIS — N644 Mastodynia: Secondary | ICD-10-CM

## 2013-12-01 DIAGNOSIS — Z09 Encounter for follow-up examination after completed treatment for conditions other than malignant neoplasm: Secondary | ICD-10-CM

## 2013-12-01 MED ORDER — NAPROXEN 500 MG PO TABS: 500.0000 mg | ORAL_TABLET | Freq: Two times a day (BID) | ORAL | Status: DC

## 2013-12-01 NOTE — Progress Notes (Signed)
Interpreter line Patient states has been seen in the ED twice for right breast pain Stated xray revealed an area in the breast that needs to be addressed

## 2013-12-01 NOTE — Progress Notes (Signed)
MRN: [id number] Name: Penny Barber  Sex: female Age: 28 y.o. DOB: 08/01/1986  Allergies: Review of patient's allergies indicates no known allergies.  Chief Complaint  Patient presents with  . Breast Pain    right    HPI: Patient is 28 y.o. female who comes today for followup, recently went to the emergency room with symptoms of right breast pain, EMR reviewed initially she was put on Bactrim which she could not tolerate and now she has been prescribed Augmentin, patient reports some improvement her denies any nipple discharge denies any fever chills still has some pain, as per patient since the symptoms started she is not nursing her baby. Patient denies any chest pain or shortness of breath.  History reviewed. No pertinent past medical history.  Past Surgical History  Procedure Laterality Date  . Cesarean section        Medication List       This list is accurate as of: 12/01/13 10:32 AM.  Always use your most recent med list.               amoxicillin-clavulanate 875-125 MG per tablet  Commonly known as:  AUGMENTIN  Take 1 tablet by mouth every 12 (twelve) hours.     naproxen 500 MG tablet  Commonly known as:  NAPROSYN  Take 1 tablet (500 mg total) by mouth 2 (two) times daily with a meal.        Meds ordered this encounter  Medications  . naproxen (NAPROSYN) 500 MG tablet    Sig: Take 1 tablet (500 mg total) by mouth 2 (two) times daily with a meal.    Dispense:  30 tablet    Refill:  0     There is no immunization history on file for this patient.  History reviewed. No pertinent family history.  History  Substance Use Topics  . Smoking status: Never Smoker   . Smokeless tobacco: Not on file  . Alcohol Use: No    Review of Systems   As noted in HPI  Filed Vitals:   12/01/13 1005  BP: 104/70  Pulse: 100  Temp: 98.1 F (36.7 C)  Resp: 17    Physical Exam  Physical Exam  Cardiovascular: Normal rate and regular rhythm.     Pulmonary/Chest: Breath sounds normal. No respiratory distress. She has no wheezes. She has no rales.   right breast examined in the presence of  medical staff as chaperone, minimal tenderness above the nipple no apparent discharge    CBC    Component Value Date/Time   WBC 7.5 11/25/2013 1933   RBC 4.50 11/25/2013 1933   HGB 13.7 11/25/2013 1933   HCT 39.8 11/25/2013 1933   PLT 239 11/25/2013 1933   MCV 88.4 11/25/2013 1933   LYMPHSABS 2.2 11/25/2013 1933   MONOABS 0.4 11/25/2013 1933   EOSABS 0.0 11/25/2013 1933   BASOSABS 0.0 11/25/2013 1933    CMP     Component Value Date/Time   NA 141 11/25/2013 1933   K 3.6* 11/25/2013 1933   CL 102 11/25/2013 1933   CO2 25 11/25/2013 1933   GLUCOSE 121* 11/25/2013 1933   BUN 17 11/25/2013 1933   CREATININE 0.64 11/25/2013 1933   CREATININE 0.57 10/08/2013 1142   CALCIUM 9.2 11/25/2013 1933   PROT 8.1 11/25/2013 1933   ALBUMIN 4.1 11/25/2013 1933   AST 18 11/25/2013 1933   ALT 12 11/25/2013 1933   ALKPHOS 88 11/25/2013 1933   BILITOT 0.3 11/25/2013  1933   GFRNONAA >90 11/25/2013 1933   GFRAA >90 11/25/2013 1933    Lab Results  Component Value Date/Time   CHOL 174 10/08/2013 11:42 AM    No components found with this basename: hga1c    Lab Results  Component Value Date/Time   AST 18 11/25/2013  7:33 PM    Assessment and Plan  Follow up  Breast pain - Plan: Most likely secondary to mastitis symptoms, prescribed her naproxen (NAPROSYN) 500 MG tablet for pain as needed.  Mastitis Continue with Augmentin and finish the course of antibiotic if she persistently have symptoms then consider referral to breast clinic.Marland Kitchen   Return if symptoms worsen or fail to improve.  Doris Cheadle, MD

## 2014-01-02 ENCOUNTER — Ambulatory Visit: Payer: Medicaid Other | Admitting: Internal Medicine

## 2014-01-27 ENCOUNTER — Encounter: Payer: Self-pay | Admitting: Internal Medicine

## 2014-01-27 ENCOUNTER — Ambulatory Visit: Payer: Medicaid Other | Attending: Internal Medicine | Admitting: Internal Medicine

## 2014-01-27 VITALS — BP 110/70 | HR 90 | Temp 97.7°F | Resp 16

## 2014-01-27 DIAGNOSIS — R7301 Impaired fasting glucose: Secondary | ICD-10-CM

## 2014-01-27 DIAGNOSIS — Z09 Encounter for follow-up examination after completed treatment for conditions other than malignant neoplasm: Secondary | ICD-10-CM

## 2014-01-27 DIAGNOSIS — Z79899 Other long term (current) drug therapy: Secondary | ICD-10-CM | POA: Insufficient documentation

## 2014-01-27 DIAGNOSIS — Z139 Encounter for screening, unspecified: Secondary | ICD-10-CM

## 2014-01-27 DIAGNOSIS — N644 Mastodynia: Secondary | ICD-10-CM | POA: Insufficient documentation

## 2014-01-27 NOTE — Progress Notes (Signed)
Patient here for follow up on her breast pain

## 2014-01-27 NOTE — Progress Notes (Signed)
MRN: [id number] Name: Penny Barber  Sex: female Age: 28 y.o. DOB: 02/03/1986  Allergies: Review of patient's allergies indicates no known allergies.  Chief Complaint  Patient presents with  . Follow-up    HPI: Patient is 28 y.o. female who comes today for followup, on the last visit she had symptoms of mastitis and breast pain was treated with antibiotic and pain medication patient reports improvement in the symptoms denies any more breast pain denies fever chills chest pain shortness of breath, last blood work reviewed her noticed impaired fasting glucose, patient denies any family history of diabetes.   History reviewed. No pertinent past medical history.  Past Surgical History  Procedure Laterality Date  . Cesarean section        Medication List       This list is accurate as of: 01/27/14  9:50 AM.  Always use your most recent med list.               amoxicillin-clavulanate 875-125 MG per tablet  Commonly known as:  AUGMENTIN  Take 1 tablet by mouth every 12 (twelve) hours.     naproxen 500 MG tablet  Commonly known as:  NAPROSYN  Take 1 tablet (500 mg total) by mouth 2 (two) times daily with a meal.        No orders of the defined types were placed in this encounter.     There is no immunization history on file for this patient.  History reviewed. No pertinent family history.  History  Substance Use Topics  . Smoking status: Never Smoker   . Smokeless tobacco: Not on file  . Alcohol Use: No    Review of Systems   As noted in HPI  Filed Vitals:   01/27/14 0940  BP: 110/70  Pulse: 90  Temp: 97.7 F (36.5 C)  Resp: 16    Physical Exam  Physical Exam  Eyes: EOM are normal. Pupils are equal, round, and reactive to light.  Cardiovascular: Normal rate and regular rhythm.   Pulmonary/Chest: Effort normal and breath sounds normal. No respiratory distress. She has no wheezes. She has no rales.  Musculoskeletal: She exhibits no edema.     CBC    Component Value Date/Time   WBC 7.5 11/25/2013 1933   RBC 4.50 11/25/2013 1933   HGB 13.7 11/25/2013 1933   HCT 39.8 11/25/2013 1933   PLT 239 11/25/2013 1933   MCV 88.4 11/25/2013 1933   LYMPHSABS 2.2 11/25/2013 1933   MONOABS 0.4 11/25/2013 1933   EOSABS 0.0 11/25/2013 1933   BASOSABS 0.0 11/25/2013 1933    CMP     Component Value Date/Time   NA 141 11/25/2013 1933   K 3.6* 11/25/2013 1933   CL 102 11/25/2013 1933   CO2 25 11/25/2013 1933   GLUCOSE 121* 11/25/2013 1933   BUN 17 11/25/2013 1933   CREATININE 0.64 11/25/2013 1933   CREATININE 0.57 10/08/2013 1142   CALCIUM 9.2 11/25/2013 1933   PROT 8.1 11/25/2013 1933   ALBUMIN 4.1 11/25/2013 1933   AST 18 11/25/2013 1933   ALT 12 11/25/2013 1933   ALKPHOS 88 11/25/2013 1933   BILITOT 0.3 11/25/2013 1933   GFRNONAA >90 11/25/2013 1933   GFRNONAA >89 09/24/2013 1720   GFRAA >90 11/25/2013 1933   GFRAA >89 09/24/2013 1720    Lab Results  Component Value Date/Time   CHOL 174 10/08/2013 11:42 AM    No components found with this basename: hga1c    Lab  Results  Component Value Date/Time   AST 18 11/25/2013  7:33 PM    Assessment and Plan  Follow up  Breast pain Resolved.  IFG (impaired fasting glucose) - Plan: Have advised patient for low carbohydrate diet, will repeat fasting COMPLETE METABOLIC PANEL WITH GFR  Screening - Plan: Vit D  25 hydroxy (rtn osteoporosis monitoring), TSH   Return in about 4 months (around 05/29/2014) for IFG.  Doris Cheadleeepak Edlyn Rosenburg, MD

## 2014-01-27 NOTE — Patient Instructions (Signed)

## 2014-03-16 ENCOUNTER — Encounter (HOSPITAL_COMMUNITY): Payer: Self-pay | Admitting: Emergency Medicine

## 2014-03-16 ENCOUNTER — Emergency Department (HOSPITAL_COMMUNITY): Payer: Medicaid Other

## 2014-03-16 ENCOUNTER — Emergency Department (HOSPITAL_COMMUNITY)
Admission: EM | Admit: 2014-03-16 | Discharge: 2014-03-17 | Disposition: A | Discharge status: Home / Self Care | Payer: Medicaid Other | Attending: Emergency Medicine | Admitting: Emergency Medicine

## 2014-03-16 DIAGNOSIS — Z3490 Encounter for supervision of normal pregnancy, unspecified, unspecified trimester: Secondary | ICD-10-CM

## 2014-03-16 DIAGNOSIS — R1013 Epigastric pain: Secondary | ICD-10-CM | POA: Insufficient documentation

## 2014-03-16 DIAGNOSIS — R1011 Right upper quadrant pain: Secondary | ICD-10-CM | POA: Insufficient documentation

## 2014-03-16 DIAGNOSIS — O9989 Other specified diseases and conditions complicating pregnancy, childbirth and the puerperium: Secondary | ICD-10-CM | POA: Insufficient documentation

## 2014-03-16 DIAGNOSIS — O219 Vomiting of pregnancy, unspecified: Secondary | ICD-10-CM

## 2014-03-16 DIAGNOSIS — O210 Mild hyperemesis gravidarum: Secondary | ICD-10-CM | POA: Insufficient documentation

## 2014-03-16 LAB — CBC WITH DIFFERENTIAL/PLATELET
Basophils Absolute: 0 10*3/uL (ref 0.0–0.1)
Basophils Relative: 0 % (ref 0–1)
EOS ABS: 0 10*3/uL (ref 0.0–0.7)
Eosinophils Relative: 1 % (ref 0–5)
HCT: 36.3 % (ref 36.0–46.0)
Hemoglobin: 12.2 g/dL (ref 12.0–15.0)
LYMPHS ABS: 1.8 10*3/uL (ref 0.7–4.0)
Lymphocytes Relative: 40 % (ref 12–46)
MCH: 29.3 pg (ref 26.0–34.0)
MCHC: 33.6 g/dL (ref 30.0–36.0)
MCV: 87.1 fL (ref 78.0–100.0)
Monocytes Absolute: 0.3 10*3/uL (ref 0.1–1.0)
Monocytes Relative: 6 % (ref 3–12)
NEUTROS ABS: 2.4 10*3/uL (ref 1.7–7.7)
NEUTROS PCT: 53 % (ref 43–77)
PLATELETS: 178 10*3/uL (ref 150–400)
RBC: 4.17 MIL/uL (ref 3.87–5.11)
RDW: 11.9 % (ref 11.5–15.5)
WBC: 4.5 10*3/uL (ref 4.0–10.5)

## 2014-03-16 LAB — COMPREHENSIVE METABOLIC PANEL
ALK PHOS: 50 U/L (ref 39–117)
ALT: 12 U/L (ref 0–35)
AST: 16 U/L (ref 0–37)
Albumin: 3.6 g/dL (ref 3.5–5.2)
BUN: 12 mg/dL (ref 6–23)
CHLORIDE: 100 meq/L (ref 96–112)
CO2: 20 mEq/L (ref 19–32)
Calcium: 9 mg/dL (ref 8.4–10.5)
Creatinine, Ser: 0.55 mg/dL (ref 0.50–1.10)
GFR calc Af Amer: >90 mL/min (ref >90)
GFR calc non Af Amer: >90 mL/min (ref >90)
GLUCOSE: 76 mg/dL (ref 70–99)
POTASSIUM: 3.6 meq/L — AB (ref 3.7–5.3)
Sodium: 136 mEq/L — ABNORMAL LOW (ref 137–147)
TOTAL PROTEIN: 7.4 g/dL (ref 6.0–8.3)
Total Bilirubin: 0.4 mg/dL (ref 0.3–1.2)

## 2014-03-16 LAB — URINALYSIS, ROUTINE W REFLEX MICROSCOPIC
BILIRUBIN URINE: NEGATIVE
Glucose, UA: NEGATIVE mg/dL
Hgb urine dipstick: NEGATIVE
Ketones, ur: NEGATIVE mg/dL
Leukocytes, UA: NEGATIVE
Nitrite: NEGATIVE
PH: 7.5 (ref 5.0–8.0)
Protein, ur: NEGATIVE mg/dL
SPECIFIC GRAVITY, URINE: 1.023 (ref 1.005–1.030)
UROBILINOGEN UA: 1 mg/dL (ref 0.0–1.0)

## 2014-03-16 LAB — WET PREP, GENITAL
CLUE CELLS WET PREP: NONE SEEN
Trich, Wet Prep: NONE SEEN
YEAST WET PREP: NONE SEEN

## 2014-03-16 LAB — HCG, QUANTITATIVE, PREGNANCY: HCG, BETA CHAIN, QUANT, S: 160012 m[IU]/mL — AB (ref <5)

## 2014-03-16 LAB — PREGNANCY, URINE: Preg Test, Ur: POSITIVE — AB

## 2014-03-16 LAB — LIPASE, BLOOD: Lipase: 19 U/L (ref 11–59)

## 2014-03-16 MED ORDER — ONDANSETRON HCL 4 MG/2ML IJ SOLN
4.0000 mg | INTRAMUSCULAR | Status: AC
  Administered 2014-03-16: 4 mg via INTRAVENOUS
  Filled 2014-03-16: qty 2

## 2014-03-16 MED ORDER — SODIUM CHLORIDE 0.9 % IV BOLUS (SEPSIS)
1000.0000 mL | Freq: Once | INTRAVENOUS | Status: AC
  Administered 2014-03-16: 1000 mL via INTRAVENOUS

## 2014-03-16 MED ORDER — ACETAMINOPHEN 500 MG PO TABS: 500.0000 mg | ORAL_TABLET | Freq: Four times a day (QID) | ORAL | Status: DC | PRN

## 2014-03-16 MED ORDER — ONDANSETRON HCL 4 MG PO TABS: 4.0000 mg | ORAL_TABLET | Freq: Four times a day (QID) | ORAL | Status: DC

## 2014-03-16 NOTE — ED Notes (Signed)
Pt reports vomiting for 50 days, also a h/a. Reports today only right sided abdominal pain, every time she eats she vomits. Also, pt reports hasn't had a period in 3 months.

## 2014-03-16 NOTE — ED Notes (Signed)
Pt given oral fluids to see how well tolerated.

## 2014-03-16 NOTE — ED Notes (Signed)
Nausea resolved per pt pt drinking po fluids without difficulty

## 2014-03-16 NOTE — ED Provider Notes (Signed)
CSN: [id number]     Arrival date & time 03/16/14  1713 History   First MD Initiated Contact with Patient 03/16/14 2100     Chief Complaint  Patient presents with  . Emesis    (Consider location/radiation/quality/duration/timing/severity/associated sxs/prior Treatment) HPI Comments: Patient is a 28 y/o [id number] female who presents to the ED for vomiting. Patient states she has been vomiting intermittently x 50 days. Patient states she feels as though food tastes funny and her mouth is dry. She states anything she eats she throws up, which has been causing her to eat less. Patient also states symptoms have been associated with abdominal pain. Pain is sharp and nonradiating. No modifying factors. No associated fever, CP, SOB, dysuria, hematuria, hematemesis, diarrhea, melena, hematochezia, vaginal bleeding, vaginal discharge, or syncope.  Abdominal surgical hx significant for C-section. LMP 12/07/13. Last BM this AM; normal, per patient.  Patient is a 28 y.o. female presenting with vomiting. The history is provided by the patient. No language interpreter was used.  Emesis Associated symptoms: abdominal pain   Associated symptoms: no diarrhea     History reviewed. No pertinent past medical history. Past Surgical History  Procedure Laterality Date  . Cesarean section     No family history on file. History  Substance Use Topics  . Smoking status: Never Smoker   . Smokeless tobacco: Not on file  . Alcohol Use: No   OB History   Grav Para Term Preterm Abortions TAB SAB Ect Mult Living                  Review of Systems  Constitutional: Negative for fever.  Respiratory: Negative for shortness of breath.   Cardiovascular: Negative for chest pain.  Gastrointestinal: Positive for nausea, vomiting and abdominal pain. Negative for diarrhea, constipation and blood in stool.  Genitourinary: Negative for dysuria, hematuria, vaginal bleeding and vaginal discharge.  Neurological: Negative for  syncope.  All other systems reviewed and are negative.    Allergies  Review of patient's allergies indicates no known allergies.  Home Medications   Prior to Admission medications   Not on File   BP 102/61  Pulse 75  Temp(Src) 98.3 F (36.8 C)  Resp 16  Wt 174 lb (78.926 kg)  SpO2 99%  LMP 12/08/2013  Physical Exam  Nursing note and vitals reviewed. Constitutional: She is oriented to person, place, and time. She appears well-developed and well-nourished. No distress.  Nontoxic, nonseptic appearing. No acute distress or discomfort noted.  HENT:  Head: Normocephalic and atraumatic.  Mouth/Throat: Oropharynx is clear and moist. No oropharyngeal exudate.  Mildly dry mm.  Eyes: Conjunctivae and EOM are normal. Pupils are equal, round, and reactive to light. No scleral icterus.  Neck: Normal range of motion.  Cardiovascular: Normal rate, regular rhythm and normal heart sounds.   Pulmonary/Chest: Effort normal. No respiratory distress. She has no wheezes. She has no rales.  Abdominal: Soft. She exhibits no distension. There is tenderness. There is no rebound and no guarding.  Abdomen soft. TTP in epigastric, RUQ and suprapubic region. Negative Murphy's sign. No TTP at McBurney's point. No peritoneal signs.  Genitourinary: Vagina normal. There is no rash, tenderness, lesion or injury on the right labia. There is no rash, tenderness, lesion or injury on the left labia. Uterus is tender. Cervix exhibits no motion tenderness and no friability. Right adnexum displays no mass, no tenderness and no fullness. Left adnexum displays tenderness. Left adnexum displays no mass and no fullness.  Musculoskeletal: Normal  range of motion.  Neurological: She is alert and oriented to person, place, and time. Coordination normal.  GCS 15. Patient moves extremities without ataxia.  Skin: Skin is warm and dry. No rash noted. She is not diaphoretic. No erythema. No pallor.  Psychiatric: She has a normal  mood and affect. Her behavior is normal.    ED Course  Procedures (including critical care time) Labs Review Labs Reviewed  WET PREP, GENITAL - Abnormal; Notable for the following:    WBC, Wet Prep HPF POC FEW (*)    All other components within normal limits  COMPREHENSIVE METABOLIC PANEL - Abnormal; Notable for the following:    Sodium 136 (*)    Potassium 3.6 (*)    All other components within normal limits  PREGNANCY, URINE - Abnormal; Notable for the following:    Preg Test, Ur POSITIVE (*)    All other components within normal limits  HCG, QUANTITATIVE, PREGNANCY - Abnormal; Notable for the following:    hCG, Beta Chain, Quant, S [id number] (*)    All other components within normal limits  GC/CHLAMYDIA PROBE AMP  CBC WITH DIFFERENTIAL  LIPASE, BLOOD  URINALYSIS, ROUTINE W REFLEX MICROSCOPIC    Imaging Review [place] Abdomen Complete  03/16/2014   CLINICAL DATA:  Right-sided abdominal pain with emesis.  EXAM: ULTRASOUND ABDOMEN COMPLETE  COMPARISON:  None.  FINDINGS: Gallbladder:  No gallstones or wall thickening visualized. No sonographic Murphy sign noted.  Common bile duct:  Diameter: Normal measuring 5.8 mm  Liver:  No focal lesion identified. Within normal limits in parenchymal echogenicity.  IVC:  No abnormality visualized.  Pancreas:  Visualized portion unremarkable.  Spleen:  Size and appearance within normal limits.  Right Kidney:  Length: 10.1 cm. Echogenicity within normal limits. No mass or hydronephrosis visualized.  Left Kidney:  Length: 9.9 cm. Echogenicity within normal limits. No mass or hydronephrosis visualized.  Abdominal aorta:  No aneurysm visualized.  Other findings:  None.  IMPRESSION: Negative exam.   Electronically Signed   By: Davonna BellingJohn  Curnes M.D.   On: 03/16/2014 22:58   [place] Ob Comp Less 14 Wks  03/16/2014   CLINICAL DATA:  Abdominal pain. Gestational age by last menstrual period 11 weeks and 2 days.  EXAM: OBSTETRIC <14 WK ULTRASOUND  TECHNIQUE: Transabdominal  ultrasound was performed for evaluation of the gestation as well as the maternal uterus and adnexal regions.  COMPARISON:  None.  FINDINGS: Intrauterine gestational sac: Visualized/normal in shape.  Yolk sac:  Present.  Embryo:  Presents  Cardiac Activity: Present.  Heart Rate: 173 bpm  CRL:   64  mm   12 w 5 d                  US EDC: September 23, 2014.  Maternal uterus/adnexae: No subchorionic hemorrhage. Right ovary is 1.9 x 1.2 x 2.9 cm, left is 2.3 x 3.4 x 1.8 cm with 1.4 cm involuting corpus luteal cyst. Cervix is closed. No free fluid.  IMPRESSION: Single live intrauterine pregnancy, 12 weeks and 5 days by ultrasound, EDD September 23, 2014 ; no subchorionic hemorrhage.   Electronically Signed   By: Awilda Metroourtnay  Bloomer   On: 03/16/2014 22:53     EKG Interpretation None      MDM   Final diagnoses:  Nausea/vomiting in pregnancy  Intrauterine pregnancy    28 year old G2 P2 female presents to the emergency department for abdominal pain with associated nausea and vomiting. Patient states her last menstrual period was 12/07/2013.  Patient found to be pregnant today with positive urine pregnancy. On physical exam, patient with soft abdomen without masses or peritoneal signs. Pelvic exam significant for mild left adnexal tenderness as well as mild uterine tenderness. Patient also with tenderness to palpation in the epigastric and right upper quadrants with a negative Murphy sign.  Labs are reassuring. Patient has no leukocytosis, anemia, or electrolyte balance. Liver and kidney function preserved. Urinalysis does not suggest infection and wet prep without evidence of yeast infection or bacterial vaginosis. Imaging pursued for further evaluation of abdominal pain, nausea, and vomiting. Abdominal ultrasound shows no acute intra-abdominal findings. OB ultrasound does show a single live intrauterine pregnancy of approximately 12 weeks 5 days. No evidence of ectopic pregnancy. No subchorionic  hemorrhage.  Patient treated in ED with IV fluids and Zofran. She is tolerating fluids by mouth without emesis. Abdominal reexamination stable. Do not believe further workup is indicated. Vomiting likely secondary to pregnancy. Patient given referral to women's outpatient clinic for prenatal care. Return precautions provided and patient agreeable to plan with no unaddressed concerns.   Filed Vitals:   03/16/14 2248 03/16/14 2249 03/16/14 2300 03/16/14 2330  BP: 102/61 102/61 104/57 103/59  Pulse: 74 75 73 71  Temp:      Resp:  16 17 19   Weight:      SpO2: 100% 99% 99% 100%     Antony Madura, PA-C 03/17/14 [phone]

## 2014-03-16 NOTE — Discharge Instructions (Signed)
Abdominal Pain During Pregnancy Abdominal pain is common in pregnancy. Most of the time, it does not cause harm. There are many causes of abdominal pain. Some causes are more serious than others. Some of the causes of abdominal pain in pregnancy are easily diagnosed. Occasionally, the diagnosis takes time to understand. Other times, the cause is not determined. Abdominal pain can be a sign that something is very wrong with the pregnancy, or the pain may have nothing to do with the pregnancy at all. For this reason, always tell your health care provider if you have any abdominal discomfort. HOME CARE INSTRUCTIONS  Monitor your abdominal pain for any changes. The following actions may help to alleviate any discomfort you are experiencing:  Do not have sexual intercourse or put anything in your vagina until your symptoms go away completely.  Get plenty of rest until your pain improves.  Drink clear fluids if you feel nauseous. Avoid solid food as long as you are uncomfortable or nauseous.  Only take over-the-counter or prescription medicine as directed by your health care provider.  Keep all follow-up appointments with your health care provider. SEEK IMMEDIATE MEDICAL CARE IF:  You are bleeding, leaking fluid, or passing tissue from the vagina.  You have increasing pain or cramping.  You have persistent vomiting.  You have painful or bloody urination.  You have a fever.  You notice a decrease in your baby's movements.  You have extreme weakness or feel faint.  You have shortness of breath, with or without abdominal pain.  You develop a severe headache with abdominal pain.  You have abnormal vaginal discharge with abdominal pain.  You have persistent diarrhea.  You have abdominal pain that continues even after rest, or gets worse. MAKE SURE YOU:   Understand these instructions.  Will watch your condition.  Will get help right away if you are not doing well or get  worse. Document Released: 09/25/2005 Document Revised: 07/16/2013 Document Reviewed: 04/24/2013 [facility] Patient Information 2014 [place], [place]. Pregnancy If you are planning on getting pregnant, it is a good idea to make a preconception appointment with your caregiver to discuss having a healthy lifestyle before getting pregnant. This includes diet, weight, exercise, taking prenatal vitamins (especially folic acid, which helps prevent brain and spinal cord defects), avoiding alcohol, smoking and illegal drugs, medical problems (diabetes, convulsions), family history of genetic problems, working conditions, and immunizations. It is better to have knowledge of these things and do something about them before getting pregnant. During your pregnancy, it is important to follow certain guidelines in order to have a healthy baby. It is very important to get good prenatal care and follow your caregiver's instructions. Prenatal care includes all the medical care you receive before your baby's birth. This helps to prevent problems during the pregnancy and childbirth. HOME CARE INSTRUCTIONS   Start your prenatal visits by the 12th week of pregnancy or earlier, if possible. At first, appointments are usually scheduled monthly. They become more frequent in the last 2 months before delivery. It is important that you keep your caregiver's appointments and follow your caregiver's instructions regarding medication use, exercise, and diet.  During pregnancy, you are providing food for you and your baby. Eat a regular, well-balanced diet. Choose foods such as meat, fish, milk and other dairy products, vegetables, fruits, whole-grain breads and cereals. Your caregiver will inform you of the ideal weight gain depending on your current height and weight. Drink lots of liquids. Try to drink 8 glasses of  water a day.  Alcohol is associated with a number of birth defects including fetal alcohol syndrome. It is best to avoid  alcohol completely. Smoking will cause low birth rate and prematurity. Use of alcohol and nicotine during your pregnancy also increases the chances that your child will be chemically dependent later in their life and may contribute to SIDS (Sudden Infant Death Syndrome).  Do not use illegal drugs.  Only take prescription or over-the-counter medications that are recommended by your caregiver. Other medications can cause genetic and physical problems in the baby.  Morning sickness can often be helped by keeping soda crackers at the bedside. Eat a few before getting up in the morning.  A sexual relationship may be continued until near the end of pregnancy if there are no other problems such as early (premature) leaking of amniotic fluid from the membranes, vaginal bleeding, painful intercourse or belly (abdominal) pain.  Exercise regularly. Check with your caregiver if you are unsure of the safety of some of your exercises.  Do not use hot tubs, steam rooms or saunas. These increase the risk of fainting and hurting yourself and the baby. Swimming is OK for exercise. Get plenty of rest, including afternoon naps when possible, especially in the third trimester.  Avoid toxic odors and chemicals.  Do not wear high heels. They may cause you to lose your balance and fall.  Do not lift over 5 pounds. If you do lift anything, lift with your legs and thighs, not your back.  Avoid long trips, especially in the third trimester.  If you have to travel out of the city or state, take a copy of your medical records with you. SEEK IMMEDIATE MEDICAL CARE IF:   You develop an unexplained oral temperature above 102 F (38.9 C), or as your caregiver suggests.  You have leaking of fluid from the vagina. If leaking membranes are suspected, take your temperature and inform your caregiver of this when you call.  There is vaginal spotting or bleeding. Notify your caregiver of the amount and how many pads are  used.  You continue to feel sick to your stomach (nauseous) and have no relief from remedies suggested, or you throw up (vomit) blood or coffee ground like materials.  You develop upper abdominal pain.  You have round ligament discomfort in the lower abdominal area. This still must be evaluated by your caregiver.  You feel contractions of the uterus.  You do not feel the baby move, or there is less movement than before.  You have painful urination.  You have abnormal vaginal discharge.  You have persistent diarrhea.  You get a severe headache.  You have problems with your vision.  You develop muscle weakness.  You feel dizzy and faint.  You develop shortness of breath.  You develop chest pain.  You have back pain that travels down to your leg and feet.  You feel irregular or a very fast heartbeat.  You develop excessive weight gain in a short period of time (5 pounds in 3 to 5 days).  You are involved in a domestic violence situation. Document Released: 09/25/2005 Document Revised: 03/26/2012 Document Reviewed: 03/19/2009 [facility] Patient Information 2014 [place], [place].

## 2014-03-17 LAB — GC/CHLAMYDIA PROBE AMP
CT Probe RNA: NEGATIVE
GC Probe RNA: NEGATIVE

## 2014-03-17 NOTE — ED Provider Notes (Signed)
Medical screening examination/treatment/procedure(s) were performed by non-physician practitioner and as supervising physician I was immediately available for consultation/collaboration.   EKG Interpretation None        Dagmar Hait, MD 03/17/14 [phone]

## 2014-03-30 ENCOUNTER — Encounter (HOSPITAL_COMMUNITY): Payer: Self-pay

## 2014-03-30 ENCOUNTER — Inpatient Hospital Stay (HOSPITAL_COMMUNITY)
Admission: AD | Admit: 2014-03-30 | Discharge: 2014-03-30 | Disposition: A | Discharge status: Home / Self Care | Payer: Medicaid Other | Source: Ambulatory Visit | Attending: Obstetrics & Gynecology | Admitting: Obstetrics & Gynecology

## 2014-03-30 DIAGNOSIS — O211 Hyperemesis gravidarum with metabolic disturbance: Secondary | ICD-10-CM | POA: Insufficient documentation

## 2014-03-30 DIAGNOSIS — E860 Dehydration: Secondary | ICD-10-CM | POA: Insufficient documentation

## 2014-03-30 DIAGNOSIS — O219 Vomiting of pregnancy, unspecified: Secondary | ICD-10-CM

## 2014-03-30 HISTORY — DX: Other specified health status: Z78.9

## 2014-03-30 LAB — URINALYSIS, ROUTINE W REFLEX MICROSCOPIC
BILIRUBIN URINE: NEGATIVE
GLUCOSE, UA: NEGATIVE mg/dL
HGB URINE DIPSTICK: NEGATIVE
KETONES UR: 15 mg/dL — AB
LEUKOCYTES UA: NEGATIVE
Nitrite: NEGATIVE
PH: 5.5 (ref 5.0–8.0)
Protein, ur: NEGATIVE mg/dL
Specific Gravity, Urine: >1.03 — ABNORMAL HIGH (ref 1.005–1.030)
Urobilinogen, UA: 0.2 mg/dL (ref 0.0–1.0)

## 2014-03-30 MED ORDER — SODIUM CHLORIDE 0.9 % IV SOLN
25.0000 mg | Freq: Once | INTRAVENOUS | Status: AC
  Administered 2014-03-30: 25 mg via INTRAVENOUS
  Filled 2014-03-30: qty 1

## 2014-03-30 MED ORDER — ONDANSETRON 4 MG PO TBDP: 4.0000 mg | ORAL_TABLET | Freq: Three times a day (TID) | ORAL | Status: DC | PRN

## 2014-03-30 MED ORDER — PROMETHAZINE HCL 25 MG PO TABS: 12.5000 mg | ORAL_TABLET | Freq: Four times a day (QID) | ORAL | Status: DC | PRN

## 2014-03-30 NOTE — Discharge Instructions (Signed)
Nausea medication to take during pregnancy:   Unisom (doxylamine succinate 25 mg tablets) Take one tablet daily at bedtime. If symptoms are not adequately controlled, the dose can be increased to a maximum recommended dose of two tablets daily (1/2 tablet in the morning, 1/2 tablet mid-afternoon and one at bedtime).  Vitamin B6 100mg  tablets. Take one tablet twice a day (up to 200 mg per day).  Add Phenergan or Zofran as prescribed to take as needed.   Nausea/Vomiting of Pregnancy  Morning sickness is when you feel sick to your stomach (nauseous) during pregnancy. This nauseous feeling may or may not come with vomiting. It often occurs in the morning but can be a problem any time of day. Morning sickness is most common during the first trimester, but it may continue throughout pregnancy. While morning sickness is unpleasant, it is usually harmless unless you develop severe and continual vomiting (hyperemesis gravidarum). This condition requires more intense treatment.  CAUSES  The cause of morning sickness is not completely known but seems to be related to normal hormonal changes that occur in pregnancy. RISK FACTORS You are at greater risk if you:  Experienced nausea or vomiting before your pregnancy.  Had morning sickness during a previous pregnancy.  Are pregnant with more than one baby, such as twins. TREATMENT  Do not use any medicines (prescription, over-the-counter, or herbal) for morning sickness without first talking to your health care provider. Your health care provider may prescribe or recommend:  Vitamin B6 supplements.  Anti-nausea medicines.  The herbal medicine ginger. HOME CARE INSTRUCTIONS   Only take over-the-counter or prescription medicines as directed by your health care provider.  Taking multivitamins before getting pregnant can prevent or decrease the severity of morning sickness in most women.  Eat a piece of dry toast or unsalted crackers before getting  out of bed in the morning.  Eat five or six small meals a day.  Eat dry and bland foods (rice, baked potato). Foods high in carbohydrates are often helpful.  Do not drink liquids with your meals. Drink liquids between meals.  Avoid greasy, fatty, and spicy foods.  Get someone to cook for you if the smell of any food causes nausea and vomiting.  If you feel nauseous after taking prenatal vitamins, take the vitamins at night or with a snack.  Snack on protein foods (nuts, yogurt, cheese) between meals if you are hungry.  Eat unsweetened gelatins for desserts.  Wearing an acupressure wristband (worn for sea sickness) may be helpful.  Acupuncture may be helpful.  Do not smoke.  Get a humidifier to keep the air in your house free of odors.  Get plenty of fresh air. SEEK MEDICAL CARE IF:   Your home remedies are not working, and you need medicine.  You feel dizzy or lightheaded.  You are losing weight. SEEK IMMEDIATE MEDICAL CARE IF:   You have persistent and uncontrolled nausea and vomiting.  You pass out (faint). MAKE SURE YOU:  Understand these instructions.  Will watch your condition.  Will get help right away if you are not doing well or get worse. Document Released: 11/16/2006 Document Revised: 09/30/2013 Document Reviewed: 03/12/2013 [facility] Patient Information 2015 [place], [place]. This information is not intended to replace advice given to you by your health care provider. Make sure you discuss any questions you have with your health care provider.

## 2014-03-30 NOTE — MAU Provider Note (Signed)
History     CSN: [id number]  Arrival date and time: 03/30/14 1410   First Penny Barber Initiated Contact with Patient 03/30/14 1634      Chief Complaint  Patient presents with  . Emesis During Pregnancy   HPI This is a 28 y.o. female at 4860w0d who presents with c/o nausea and vomiting of pregnancy. Had been Rxed meds for this but ran out of them. Has appt in clinic coming up.  Denies pain or other problems  RN Note:  Patient states she has vomiting for 20 days. Not able to keep anything down. Denies pain or bleeding.        OB History   Grav Para Term Preterm Abortions TAB SAB Ect Mult Living   3 2 1 1      2       Past Medical History  Diagnosis Date  . Medical history non-contributory     Past Surgical History  Procedure Laterality Date  . Cesarean section      Family History  Problem Relation Age of Onset  . Alcohol abuse Neg Hx   . Arthritis Neg Hx   . Asthma Neg Hx   . Birth defects Neg Hx   . Cancer Neg Hx   . COPD Neg Hx   . Depression Neg Hx   . Diabetes Neg Hx   . Drug abuse Neg Hx   . Early death Neg Hx   . Hearing loss Neg Hx   . Heart disease Neg Hx   . Hyperlipidemia Neg Hx   . Hypertension Neg Hx   . Kidney disease Neg Hx   . Learning disabilities Neg Hx   . Mental illness Neg Hx   . Mental retardation Neg Hx   . Miscarriages / Stillbirths Neg Hx   . Stroke Neg Hx   . Vision loss Neg Hx   . Varicose Veins Neg Hx     History  Substance Use Topics  . Smoking status: Never Smoker   . Smokeless tobacco: Not on file  . Alcohol Use: No    Allergies: No Known Allergies  Prescriptions prior to admission  Medication Sig Dispense Refill  . acetaminophen (TYLENOL) 500 MG tablet Take 1 tablet (500 mg total) by mouth every 6 (six) hours as needed.  30 tablet  0  . ondansetron (ZOFRAN) 4 MG tablet Take 1 tablet (4 mg total) by mouth every 6 (six) hours.  20 tablet  0  . Prenatal Vit-Fe Fumarate-FA (PRENATAL MULTIVITAMIN) TABS tablet Take 1  tablet by mouth daily at 12 noon.        Review of Systems  Constitutional: Positive for malaise/fatigue. Negative for fever and chills.  Gastrointestinal: Positive for nausea and vomiting. Negative for abdominal pain.  Neurological: Positive for weakness.   Physical Exam   Blood pressure 103/60, pulse 77, temperature 98.1 F (36.7 C), temperature source Oral, resp. rate 18, height 5\' 3"  (1.6 m), weight 77.656 kg (171 lb 3.2 oz), last menstrual period 12/08/2013, SpO2 99.00%.  Physical Exam  Constitutional: She is oriented to person, place, and time. She appears well-developed. No distress.  HENT:  Head: Normocephalic.  Cardiovascular: Normal rate.   Respiratory: Effort normal.  GI: Soft. She exhibits no distension. There is no tenderness. There is no rebound and no guarding.  Musculoskeletal: Normal range of motion.  Neurological: She is alert and oriented to person, place, and time.  Skin: Skin is warm and dry. She is not diaphoretic.  Psychiatric: She has  a normal mood and affect.    MAU Course  Procedures  MDM Results for orders placed during the hospital encounter of 03/30/14 (from the past 24 hour(s))  URINALYSIS, ROUTINE W REFLEX MICROSCOPIC     Status: Abnormal   Collection Time    03/30/14  2:30 PM      Result Value Ref Range   Color, Urine YELLOW  YELLOW   APPearance CLEAR  CLEAR   Specific Gravity, Urine >1.030 (*) 1.005 - 1.030   pH 5.5  5.0 - 8.0   Glucose, UA NEGATIVE  NEGATIVE mg/dL   Hgb urine dipstick NEGATIVE  NEGATIVE   Bilirubin Urine NEGATIVE  NEGATIVE   Ketones, ur 15 (*) NEGATIVE mg/dL   Protein, ur NEGATIVE  NEGATIVE mg/dL   Urobilinogen, UA 0.2  0.0 - 1.0 mg/dL   Nitrite NEGATIVE  NEGATIVE   Leukocytes, UA NEGATIVE  NEGATIVE    WIll hydrate with Phenergan infusion.  Assessment and Plan  A:  SIUP at 3790w0d        Nausea and vomiting of pregnancy.       Mild dehydration  P:  Report to oncoming NP  [facility] LLCWILLIAMS,Penny 03/30/2014, 5:28 PM     FHT 160 by doppler  Pt symptoms improved with medication/IV fluids D/C pt Zofran and Phenergan Rx Discussed use of Unisom/B6 daily F/U in clinic as scheduled Return to MAU as needed if symptoms persist or worsen  Sharen CounterLisa Leftwich-Kirby Certified Nurse-Midwife

## 2014-03-30 NOTE — MAU Note (Signed)
Patient states she has vomiting for 20 days. Not able to keep anything down. Denies pain or bleeding.

## 2014-04-07 NOTE — MAU Provider Note (Signed)
Attestation of Attending Supervision of Advanced Practitioner (CNM/NP): Evaluation and management procedures were performed by the Advanced Practitioner under my supervision and collaboration. I have reviewed the Advanced Practitioner's note and chart, and I agree with the management and plan.  LEGGETT,KELLY H. 6:47 AM

## 2014-04-22 ENCOUNTER — Inpatient Hospital Stay (HOSPITAL_COMMUNITY)
Admission: AD | Admit: 2014-04-22 | Discharge: 2014-04-22 | Disposition: A | Discharge status: Home / Self Care | Payer: Medicaid Other | Source: Ambulatory Visit | Attending: Obstetrics & Gynecology | Admitting: Obstetrics & Gynecology

## 2014-04-22 ENCOUNTER — Encounter (HOSPITAL_COMMUNITY): Payer: Self-pay | Admitting: *Deleted

## 2014-04-22 DIAGNOSIS — O219 Vomiting of pregnancy, unspecified: Secondary | ICD-10-CM

## 2014-04-22 DIAGNOSIS — O210 Mild hyperemesis gravidarum: Secondary | ICD-10-CM | POA: Insufficient documentation

## 2014-04-22 DIAGNOSIS — O99891 Other specified diseases and conditions complicating pregnancy: Secondary | ICD-10-CM | POA: Diagnosis not present

## 2014-04-22 DIAGNOSIS — R630 Anorexia: Secondary | ICD-10-CM | POA: Diagnosis present

## 2014-04-22 DIAGNOSIS — O9989 Other specified diseases and conditions complicating pregnancy, childbirth and the puerperium: Secondary | ICD-10-CM

## 2014-04-22 LAB — URINALYSIS, ROUTINE W REFLEX MICROSCOPIC
Bilirubin Urine: NEGATIVE
Glucose, UA: NEGATIVE mg/dL
Hgb urine dipstick: NEGATIVE
Ketones, ur: NEGATIVE mg/dL
Leukocytes, UA: NEGATIVE
NITRITE: NEGATIVE
Protein, ur: NEGATIVE mg/dL
SPECIFIC GRAVITY, URINE: 1.02 (ref 1.005–1.030)
Urobilinogen, UA: 0.2 mg/dL (ref 0.0–1.0)
pH: 6 (ref 5.0–8.0)

## 2014-04-22 NOTE — MAU Note (Signed)
C/o feeling nausea all day today and c/o headache also; has not eaten much or drank much today-states that she does not have a good appetite;

## 2014-04-22 NOTE — MAU Provider Note (Signed)
History     CSN: [id number]  Arrival date and time: 04/22/14 1728   First Provider Initiated Contact with Patient 04/22/14 1821      Chief Complaint  Patient presents with  . Headache  . Nausea   HPI Used [place] interpreter via language line  Penny Barber is a 28 y.o. [id number] at 625w2d who presents today with decreased appetite and nausea. She denies any vomiting. She states that she last ate this morning, but did not eat lunch or dinner. She states that nothing tastes good. She states that she has had water today. She states that she cannot think of anything that she wants to eat.   Past Medical History  Diagnosis Date  . Medical history non-contributory     Past Surgical History  Procedure Laterality Date  . Cesarean section      Family History  Problem Relation Age of Onset  . Alcohol abuse Neg Hx   . Arthritis Neg Hx   . Asthma Neg Hx   . Birth defects Neg Hx   . Cancer Neg Hx   . COPD Neg Hx   . Depression Neg Hx   . Diabetes Neg Hx   . Drug abuse Neg Hx   . Early death Neg Hx   . Hearing loss Neg Hx   . Heart disease Neg Hx   . Hyperlipidemia Neg Hx   . Hypertension Neg Hx   . Kidney disease Neg Hx   . Learning disabilities Neg Hx   . Mental illness Neg Hx   . Mental retardation Neg Hx   . Miscarriages / Stillbirths Neg Hx   . Stroke Neg Hx   . Vision loss Neg Hx   . Varicose Veins Neg Hx     History  Substance Use Topics  . Smoking status: Never Smoker   . Smokeless tobacco: Not on file  . Alcohol Use: No    Allergies: No Known Allergies  Prescriptions prior to admission  Medication Sig Dispense Refill  . ondansetron (ZOFRAN ODT) 4 MG disintegrating tablet Take 1-2 tablets (4-8 mg total) by mouth every 8 (eight) hours as needed for nausea.  20 tablet  3  . Prenatal Vit-Fe Fumarate-FA (PRENATAL MULTIVITAMIN) TABS tablet Take 1 tablet by mouth daily at 12 noon.      . promethazine (PHENERGAN) 25 MG tablet Take 0.5-1 tablets (12.5-25 mg  total) by mouth every 6 (six) hours as needed.  30 tablet  2    ROS Physical Exam   Blood pressure 102/61, pulse 91, temperature 97.9 F (36.6 C), temperature source Oral, resp. rate 18, last menstrual period 12/08/2013.  Physical Exam  Nursing note and vitals reviewed. Constitutional: She is oriented to person, place, and time. She appears well-developed and well-nourished. No distress.  Cardiovascular: Normal rate.   Respiratory: Effort normal.  GI: Soft. There is no tenderness. There is no rebound.  Neurological: She is alert and oriented to person, place, and time.  Skin: Skin is warm and dry.  Psychiatric: She has a normal mood and affect.    MAU Course  Procedures Results for orders placed during the hospital encounter of 04/22/14 (from the past 24 hour(s))  URINALYSIS, ROUTINE W REFLEX MICROSCOPIC     Status: None   Collection Time    04/22/14  6:00 PM      Result Value Ref Range   Color, Urine YELLOW  YELLOW   APPearance CLEAR  CLEAR   Specific Gravity, Urine 1.020  1.005 -  1.030   pH 6.0  5.0 - 8.0   Glucose, UA NEGATIVE  NEGATIVE mg/dL   Hgb urine dipstick NEGATIVE  NEGATIVE   Bilirubin Urine NEGATIVE  NEGATIVE   Ketones, ur NEGATIVE  NEGATIVE mg/dL   Protein, ur NEGATIVE  NEGATIVE mg/dL   Urobilinogen, UA 0.2  0.0 - 1.0 mg/dL   Nitrite NEGATIVE  NEGATIVE   Leukocytes, UA NEGATIVE  NEGATIVE   1919: Patient has had gingerale and crackers. She states that she is feeling better   Assessment and Plan   1. Nausea/vomiting in pregnancy    Small frequent meals Drink fluids throughout the day Return to MAU as needed  Follow-up Information   Follow up with [facility]. (As scheduled)    Specialty:  Obstetrics and Gynecology   Contact information:   [address] [place] [address] [phone]       Tawnya Crook 04/22/2014, 6:23 PM

## 2014-04-22 NOTE — Discharge Instructions (Signed)
°  WHAT CAN I DO TO RELIEVE MY SYMPTOMS? Listen to your body. Everyone is different and has different preferences. Find what works best for you. Some of the following things may help:  Eat and drink slowly.  Eat 5-6 small meals daily instead of 3 large meals.   Eat crackers before you get out of bed in the morning.   Starchy foods are usually well tolerated (such as cereal, toast, bread, potatoes, pasta, rice, and pretzels).   Ginger may help with nausea. Add  tsp ground ginger to hot tea or choose ginger tea.   Try drinking 100% fruit juice or an electrolyte drink.  Continue to take your prenatal vitamins as directed by your health care provider. If you are having trouble taking your prenatal vitamins, talk with your health care provider about different options.  Include at least 1 serving of protein with your meals and snacks (such as meats or poultry, beans, nuts, eggs, or yogurt). Try eating a protein-rich snack before bed (such as cheese and crackers or a half [place] or peanut butter sandwich). WHAT THINGS SHOULD I AVOID TO REDUCE MY SYMPTOMS? The following things may help reduce your symptoms:  Avoid foods with strong smells. Try eating meals in well-ventilated areas that are free of odors.  Avoid drinking water or other beverages with meals. Try not to drink anything less than 30 minutes before and after meals.  Avoid drinking more than 1 cup of fluid at a time.  Avoid fried or high-fat foods, such as butter and cream sauces.  Avoid spicy foods.  Avoid skipping meals the best you can. Nausea can be more intense on an empty stomach. If you cannot tolerate food at that time, do not force it. Try sucking on ice chips or other frozen items and make up the calories later.  Avoid lying down within 2 hours after eating. Document Released: 07/23/2007 Document Revised: 09/30/2013 Document Reviewed: 07/30/2013 [facility] Patient Information 2015 [place]. This information is  not intended to replace advice given to you by your health care provider. Make sure you discuss any questions you have with your health care provider.

## 2014-04-25 NOTE — MAU Provider Note (Signed)
Attestation of Attending Supervision of Advanced Practitioner (CNM/NP): Evaluation and management procedures were performed by the Advanced Practitioner under my supervision and collaboration.  I have reviewed the Advanced Practitioner's note and chart, and I agree with the management and plan.  HARRAWAY-SMITH, Keyron Pokorski 9:11 AM     

## 2014-04-29 ENCOUNTER — Encounter: Payer: Self-pay | Admitting: Advanced Practice Midwife

## 2014-04-29 ENCOUNTER — Other Ambulatory Visit (HOSPITAL_COMMUNITY)
Admission: RE | Admit: 2014-04-29 | Discharge: 2014-04-29 | Disposition: A | Discharge status: Home / Self Care | Payer: Medicaid Other | Source: Ambulatory Visit | Attending: Advanced Practice Midwife | Admitting: Advanced Practice Midwife

## 2014-04-29 ENCOUNTER — Ambulatory Visit (INDEPENDENT_AMBULATORY_CARE_PROVIDER_SITE_OTHER): Payer: Medicaid Other | Admitting: Advanced Practice Midwife

## 2014-04-29 VITALS — BP 100/61 | HR 78 | Temp 97.6°F | Wt 178.9 lb

## 2014-04-29 DIAGNOSIS — Z3492 Encounter for supervision of normal pregnancy, unspecified, second trimester: Secondary | ICD-10-CM

## 2014-04-29 DIAGNOSIS — Z01419 Encounter for gynecological examination (general) (routine) without abnormal findings: Secondary | ICD-10-CM | POA: Diagnosis not present

## 2014-04-29 DIAGNOSIS — Z3480 Encounter for supervision of other normal pregnancy, unspecified trimester: Secondary | ICD-10-CM

## 2014-04-29 DIAGNOSIS — Z113 Encounter for screening for infections with a predominantly sexual mode of transmission: Secondary | ICD-10-CM | POA: Insufficient documentation

## 2014-04-29 LAB — POCT URINALYSIS DIP (DEVICE)
BILIRUBIN URINE: NEGATIVE
Glucose, UA: NEGATIVE mg/dL
Hgb urine dipstick: NEGATIVE
Ketones, ur: NEGATIVE mg/dL
LEUKOCYTES UA: NEGATIVE
Nitrite: NEGATIVE
PH: 5.5 (ref 5.0–8.0)
Protein, ur: NEGATIVE mg/dL
Specific Gravity, Urine: 1.025 (ref 1.005–1.030)
Urobilinogen, UA: 0.2 mg/dL (ref 0.0–1.0)

## 2014-04-29 NOTE — Progress Notes (Signed)
Subjective:    Penny Barber is a [id number] 4035w2d being seen today for her first obstetrical visit.  Her obstetrical history is significant for Previous SVD preterm @[redacted]w[redacted]d  with baby 1 and c-section with baby2 @ 37 wks. Patient does intend to breast feed. Pregnancy history fully reviewed.  Patient reports no bleeding and no contractions. Denies headache, SOB, chest pain, abdominal pain, nausea and vomiting.   Filed Vitals:   04/29/14 1015  BP: 100/61  Pulse: 78  Temp: 97.6 F (36.4 C)  Weight: 81.149 kg (178 lb 14.4 oz)    HISTORY: OB History  Gravida Para Term Preterm AB SAB TAB Ectopic Multiple Living  3 2 1 1      2     # Outcome Date GA Lbr Len/2nd Weight Sex Delivery Anes PTL Lv  3 CUR           2 PRE 01/26/12 1981w5d    SVD     1 TRM 05/09/08 7271w0d   M CS   Y     Past Medical History  Diagnosis Date  . Medical history non-contributory    Past Surgical History  Procedure Laterality Date  . Cesarean section     Family History  Problem Relation Age of Onset  . Alcohol abuse Neg Hx   . Arthritis Neg Hx   . Asthma Neg Hx   . Birth defects Neg Hx   . Cancer Neg Hx   . COPD Neg Hx   . Depression Neg Hx   . Diabetes Neg Hx   . Drug abuse Neg Hx   . Early death Neg Hx   . Hearing loss Neg Hx   . Heart disease Neg Hx   . Hyperlipidemia Neg Hx   . Hypertension Neg Hx   . Kidney disease Neg Hx   . Learning disabilities Neg Hx   . Mental illness Neg Hx   . Mental retardation Neg Hx   . Miscarriages / Stillbirths Neg Hx   . Stroke Neg Hx   . Vision loss Neg Hx   . Varicose Veins Neg Hx      Exam    Uterus: normal    Pelvic Exam:    Perineum: No Hemorrhoids   Vulva: normal   Vagina:  normal mucosa, normal discharge       Cervix: no cervical motion tenderness, no lesions and nulliparous appearance   Adnexa: normal adnexa and no mass, fullness, tenderness   Bony Pelvis: average  System: Breast:  normal appearance, no masses or tenderness, No nipple  retraction or dimpling, No axillary or supraclavicular adenopathy   Skin: normal coloration and turgor, no rashes    Neurologic: oriented, grossly non-focal   Extremities: no deformities, no erythema, induration, or nodules, Pedal pulses 2+   HEENT neck supple with midline trachea and thyroid without masses   Mouth/Teeth mucous membranes moist, pharynx normal without lesions and dental hygiene good   Neck supple and no masses   Cardiovascular: regular rate and rhythm, no murmurs or gallops   Respiratory:  appears well, vitals normal, no respiratory distress, acyanotic, normal RR, chest clear, no wheezing, crepitations, rhonchi, normal symmetric air entry   Abdomen: soft, non-tender; bowel sounds normal; no masses,  no organomegaly   Urinary: urethral meatus normal      Assessment:    Pregnancy: [id number] Patient Active Problem List   Diagnosis Date Noted  . IFG (impaired fasting glucose) 01/27/2014  . Back pain 09/24/2013  . Abdominal pain, left  lower quadrant 09/24/2013    Breast      Plan:     Initial labs drawn. Prenatal vitamins. Problem list reviewed and updated. Genetic Screening not discussed  Ultrasound discussed; fetal survey: ordered. Follow up in 4 weeks.  Patient is doing well and does not report any problems. She has not yet felt baby move. Is completing GTT today and scheduling [place]. Used language line for communication.       Penny Barber 04/29/2014

## 2014-04-29 NOTE — Progress Notes (Signed)
[place] interpreter [phone]#[id number] used.  Early 1hr today and initial lab wor-- to be drawn at 1130. Pap today.  Discussed appropriate weight gain (11-20lb); patient verbalized understanding.

## 2014-04-30 ENCOUNTER — Encounter: Payer: Self-pay | Admitting: Advanced Practice Midwife

## 2014-04-30 DIAGNOSIS — O9989 Other specified diseases and conditions complicating pregnancy, childbirth and the puerperium: Secondary | ICD-10-CM

## 2014-04-30 DIAGNOSIS — Z603 Acculturation difficulty: Secondary | ICD-10-CM | POA: Insufficient documentation

## 2014-04-30 DIAGNOSIS — O09899 Supervision of other high risk pregnancies, unspecified trimester: Secondary | ICD-10-CM | POA: Insufficient documentation

## 2014-04-30 DIAGNOSIS — Z283 Underimmunization status: Secondary | ICD-10-CM | POA: Insufficient documentation

## 2014-04-30 DIAGNOSIS — O34219 Maternal care for unspecified type scar from previous cesarean delivery: Secondary | ICD-10-CM | POA: Insufficient documentation

## 2014-04-30 LAB — CULTURE, OB URINE: Colony Count: 30000

## 2014-04-30 LAB — OBSTETRIC PANEL
ANTIBODY SCREEN: NEGATIVE
Basophils Absolute: 0 10*3/uL (ref 0.0–0.1)
Basophils Relative: 0 % (ref 0–1)
Eosinophils Absolute: 0 10*3/uL (ref 0.0–0.7)
Eosinophils Relative: 1 % (ref 0–5)
HEMATOCRIT: 34.7 % — AB (ref 36.0–46.0)
HEMOGLOBIN: 11.8 g/dL — AB (ref 12.0–15.0)
Hepatitis B Surface Ag: NEGATIVE
LYMPHS PCT: 34 % (ref 12–46)
Lymphs Abs: 1.4 10*3/uL (ref 0.7–4.0)
MCH: 29 pg (ref 26.0–34.0)
MCHC: 34 g/dL (ref 30.0–36.0)
MCV: 85.3 fL (ref 78.0–100.0)
MONO ABS: 0.2 10*3/uL (ref 0.1–1.0)
MONOS PCT: 5 % (ref 3–12)
Neutro Abs: 2.5 10*3/uL (ref 1.7–7.7)
Neutrophils Relative %: 60 % (ref 43–77)
Platelets: 225 10*3/uL (ref 150–400)
RBC: 4.07 MIL/uL (ref 3.87–5.11)
RDW: 13.5 % (ref 11.5–15.5)
RH TYPE: POSITIVE
RUBELLA: 0.88 {index} (ref <0.90)
WBC: 4.2 10*3/uL (ref 4.0–10.5)

## 2014-04-30 LAB — GLUCOSE TOLERANCE, 1 HOUR (50G) W/O FASTING: Glucose, 1 Hour GTT: 123 mg/dL (ref 70–140)

## 2014-04-30 LAB — HIV ANTIBODY (ROUTINE TESTING W REFLEX): HIV 1&2 Ab, 4th Generation: NONREACTIVE

## 2014-04-30 LAB — CYTOLOGY - PAP

## 2014-04-30 NOTE — Patient Instructions (Signed)
Second Trimester of Pregnancy The second trimester is from week 13 through week 28, months 4 through 6. The second trimester is often a time when you feel your best. Your body has also adjusted to being pregnant, and you begin to feel better physically. Usually, morning sickness has lessened or quit completely, you may have more energy, and you may have an increase in appetite. The second trimester is also a time when the fetus is growing rapidly. At the end of the sixth month, the fetus is about 9 inches long and weighs about 1 pounds. You will likely begin to feel the baby move (quickening) between 18 and 20 weeks of the pregnancy. BODY CHANGES Your body goes through many changes during pregnancy. The changes vary from woman to woman.   Your weight will continue to increase. You will notice your lower abdomen bulging out.  You may begin to get stretch marks on your hips, abdomen, and breasts.  You may develop headaches that can be relieved by medicines approved by your health care provider.  You may urinate more often because the fetus is pressing on your bladder.  You may develop or continue to have heartburn as a result of your pregnancy.  You may develop constipation because certain hormones are causing the muscles that push waste through your intestines to slow down.  You may develop hemorrhoids or swollen, bulging veins (varicose veins).  You may have back pain because of the weight gain and pregnancy hormones relaxing your joints between the bones in your pelvis and as a result of a shift in weight and the muscles that support your balance.  Your breasts will continue to grow and be tender.  Your gums may bleed and may be sensitive to brushing and flossing.  Dark spots or blotches (chloasma, mask of pregnancy) may develop on your face. This will likely fade after the baby is born.  A dark line from your belly button to the pubic area (linea nigra) may appear. This will likely fade  after the baby is born.  You may have changes in your hair. These can include thickening of your hair, rapid growth, and changes in texture. Some women also have hair loss during or after pregnancy, or hair that feels dry or thin. Your hair will most likely return to normal after your baby is born. WHAT TO EXPECT AT YOUR PRENATAL VISITS During a routine prenatal visit:  You will be weighed to make sure you and the fetus are growing normally.  Your blood pressure will be taken.  Your abdomen will be measured to track your baby's growth.  The fetal heartbeat will be listened to.  Any test results from the previous visit will be discussed. Your health care provider may ask you:  How you are feeling.  If you are feeling the baby move.  If you have had any abnormal symptoms, such as leaking fluid, bleeding, severe headaches, or abdominal cramping.  If you have any questions. Other tests that may be performed during your second trimester include:  Blood tests that check for:  Low iron levels (anemia).  Gestational diabetes (between 24 and 28 weeks).  Rh antibodies.  Urine tests to check for infections, diabetes, or protein in the urine.  An ultrasound to confirm the proper growth and development of the baby.  An amniocentesis to check for possible genetic problems.  Fetal screens for spina bifida and Down syndrome. HOME CARE INSTRUCTIONS   Avoid all smoking, herbs, alcohol, and unprescribed   drugs. These chemicals affect the formation and growth of the baby.  Follow your health care provider's instructions regarding medicine use. There are medicines that are either safe or unsafe to take during pregnancy.  Exercise only as directed by your health care provider. Experiencing uterine cramps is a good sign to stop exercising.  Continue to eat regular, healthy meals.  Wear a good support bra for breast tenderness.  Do not use hot tubs, steam rooms, or saunas.  Wear your  seat belt at all times when driving.  Avoid raw meat, uncooked cheese, cat litter boxes, and soil used by cats. These carry germs that can cause birth defects in the baby.  Take your prenatal vitamins.  Try taking a stool softener (if your health care provider approves) if you develop constipation. Eat more high-fiber foods, such as fresh vegetables or fruit and whole grains. Drink plenty of fluids to keep your urine clear or pale yellow.  Take warm sitz baths to soothe any pain or discomfort caused by hemorrhoids. Use hemorrhoid cream if your health care provider approves.  If you develop varicose veins, wear support hose. Elevate your feet for 15 minutes, 3-4 times a day. Limit salt in your diet.  Avoid heavy lifting, wear low heel shoes, and practice good posture.  Rest with your legs elevated if you have leg cramps or low back pain.  Visit your dentist if you have not gone yet during your pregnancy. Use a soft toothbrush to brush your teeth and be gentle when you floss.  A sexual relationship may be continued unless your health care provider directs you otherwise.  Continue to go to all your prenatal visits as directed by your health care provider. SEEK MEDICAL CARE IF:   You have dizziness.  You have mild pelvic cramps, pelvic pressure, or nagging pain in the abdominal area.  You have persistent nausea, vomiting, or diarrhea.  You have a bad smelling vaginal discharge.  You have pain with urination. SEEK IMMEDIATE MEDICAL CARE IF:   You have a fever.  You are leaking fluid from your vagina.  You have spotting or bleeding from your vagina.  You have severe abdominal cramping or pain.  You have rapid weight gain or loss.  You have shortness of breath with chest pain.  You notice sudden or extreme swelling of your face, hands, ankles, feet, or legs.  You have not felt your baby move in over an hour.  You have severe headaches that do not go away with  medicine.  You have vision changes. Document Released: 09/19/2001 Document Revised: 09/30/2013 Document Reviewed: 11/26/2012 ExitCare Patient Information 2015 [employer], LLC. This information is not intended to replace advice given to you by your health care provider. Make sure you discuss any questions you have with your health care provider.  

## 2014-04-30 NOTE — Progress Notes (Signed)
Seen and examined by me also. Agree with note.

## 2014-05-01 LAB — PRESCRIPTION MONITORING PROFILE (19 PANEL)
AMPHETAMINE/METH: NEGATIVE ng/mL
Barbiturate Screen, Urine: NEGATIVE ng/mL
Benzodiazepine Screen, Urine: NEGATIVE ng/mL
Buprenorphine, Urine: NEGATIVE ng/mL
COCAINE METABOLITES: NEGATIVE ng/mL
CREATININE, URINE: 235.19 mg/dL (ref >20.0)
Cannabinoid Scrn, Ur: NEGATIVE ng/mL
Carisoprodol, Urine: NEGATIVE ng/mL
FENTANYL URINE: NEGATIVE ng/mL
MDMA URINE: NEGATIVE ng/mL
Meperidine, Ur: NEGATIVE ng/mL
Methadone Screen, Urine: NEGATIVE ng/mL
Methaqualone: NEGATIVE ng/mL
Nitrites, Initial: NEGATIVE ug/mL
OPIATE SCREEN, URINE: NEGATIVE ng/mL
Oxycodone Screen, Ur: NEGATIVE ng/mL
PH URINE, INITIAL: 6.2 pH (ref 4.5–8.9)
Phencyclidine, Ur: NEGATIVE ng/mL
Propoxyphene: NEGATIVE ng/mL
TAPENTADOLUR: NEGATIVE ng/mL
TRAMADOL UR: NEGATIVE ng/mL
Zolpidem, Urine: NEGATIVE ng/mL

## 2014-05-01 LAB — HEMOGLOBINOPATHY EVALUATION
Hemoglobin Other: 0 %
Hgb A2 Quant: 2.5 % (ref 2.2–3.2)
Hgb A: 97.1 % (ref 96.8–97.8)
Hgb F Quant: 0.4 % (ref 0.0–2.0)
Hgb S Quant: 0 %

## 2014-05-06 ENCOUNTER — Ambulatory Visit (HOSPITAL_COMMUNITY)
Admission: RE | Admit: 2014-05-06 | Discharge: 2014-05-06 | Disposition: A | Discharge status: Home / Self Care | Payer: Medicaid Other | Source: Ambulatory Visit | Attending: Advanced Practice Midwife | Admitting: Advanced Practice Midwife

## 2014-05-06 ENCOUNTER — Encounter: Payer: Self-pay | Admitting: Advanced Practice Midwife

## 2014-05-06 DIAGNOSIS — Z3689 Encounter for other specified antenatal screening: Secondary | ICD-10-CM | POA: Diagnosis not present

## 2014-05-06 DIAGNOSIS — Z3492 Encounter for supervision of normal pregnancy, unspecified, second trimester: Secondary | ICD-10-CM

## 2014-05-27 ENCOUNTER — Telehealth: Payer: Self-pay | Admitting: *Deleted

## 2014-05-27 ENCOUNTER — Encounter: Payer: Self-pay | Admitting: Advanced Practice Midwife

## 2014-05-27 ENCOUNTER — Ambulatory Visit (INDEPENDENT_AMBULATORY_CARE_PROVIDER_SITE_OTHER): Payer: Self-pay | Admitting: Advanced Practice Midwife

## 2014-05-27 VITALS — BP 98/58 | HR 89 | Wt 181.0 lb

## 2014-05-27 DIAGNOSIS — O3421 Maternal care for scar from previous cesarean delivery: Secondary | ICD-10-CM

## 2014-05-27 DIAGNOSIS — IMO0002 Reserved for concepts with insufficient information to code with codable children: Secondary | ICD-10-CM

## 2014-05-27 DIAGNOSIS — O34219 Maternal care for unspecified type scar from previous cesarean delivery: Secondary | ICD-10-CM

## 2014-05-27 DIAGNOSIS — Z1389 Encounter for screening for other disorder: Secondary | ICD-10-CM

## 2014-05-27 DIAGNOSIS — Z0489 Encounter for examination and observation for other specified reasons: Secondary | ICD-10-CM

## 2014-05-27 DIAGNOSIS — Z363 Encounter for antenatal screening for malformations: Secondary | ICD-10-CM

## 2014-05-27 LAB — POCT URINALYSIS DIP (DEVICE)
Glucose, UA: NEGATIVE mg/dL
Hgb urine dipstick: NEGATIVE
Ketones, ur: NEGATIVE mg/dL
Leukocytes, UA: NEGATIVE
NITRITE: NEGATIVE
PH: 5 (ref 5.0–8.0)
Protein, ur: NEGATIVE mg/dL
Specific Gravity, Urine: 1.025 (ref 1.005–1.030)
Urobilinogen, UA: 0.2 mg/dL (ref 0.0–1.0)

## 2014-05-27 NOTE — Progress Notes (Signed)
Used [employer] interpreter [phone]#[phone]

## 2014-05-27 NOTE — Patient Instructions (Addendum)
Due date 09/23/2014 based on 12.5 week ultrasound.  Second Trimester of Pregnancy The second trimester is from week 13 through week 28, months 4 through 6. The second trimester is often a time when you feel your best. Your body has also adjusted to being pregnant, and you begin to feel better physically. Usually, morning sickness has lessened or quit completely, you may have more energy, and you may have an increase in appetite. The second trimester is also a time when the fetus is growing rapidly. At the end of the sixth month, the fetus is about 9 inches long and weighs about 1 pounds. You will likely begin to feel the baby move (quickening) between 18 and 20 weeks of the pregnancy. BODY CHANGES Your body goes through many changes during pregnancy. The changes vary from woman to woman.   Your weight will continue to increase. You will notice your lower abdomen bulging out.  You may begin to get stretch marks on your hips, abdomen, and breasts.  You may develop headaches that can be relieved by medicines approved by your health care provider.  You may urinate more often because the fetus is pressing on your bladder.  You may develop or continue to have heartburn as a result of your pregnancy.  You may develop constipation because certain hormones are causing the muscles that push waste through your intestines to slow down.  You may develop hemorrhoids or swollen, bulging veins (varicose veins).  You may have back pain because of the weight gain and pregnancy hormones relaxing your joints between the bones in your pelvis and as a result of a shift in weight and the muscles that support your balance.  Your breasts will continue to grow and be tender.  Your gums may bleed and may be sensitive to brushing and flossing.  Dark spots or blotches (chloasma, mask of pregnancy) may develop on your face. This will likely fade after the baby is born.  A dark line from your belly button to the  pubic area (linea nigra) may appear. This will likely fade after the baby is born.  You may have changes in your hair. These can include thickening of your hair, rapid growth, and changes in texture. Some women also have hair loss during or after pregnancy, or hair that feels dry or thin. Your hair will most likely return to normal after your baby is born. WHAT TO EXPECT AT YOUR PRENATAL VISITS During a routine prenatal visit:  You will be weighed to make sure you and the fetus are growing normally.  Your blood pressure will be taken.  Your abdomen will be measured to track your baby's growth.  The fetal heartbeat will be listened to.  Any test results from the previous visit will be discussed. Your health care provider may ask you:  How you are feeling.  If you are feeling the baby move.  If you have had any abnormal symptoms, such as leaking fluid, bleeding, severe headaches, or abdominal cramping.  If you have any questions. Other tests that may be performed during your second trimester include:  Blood tests that check for:  Low iron levels (anemia).  Gestational diabetes (between 24 and 28 weeks).  Rh antibodies.  Urine tests to check for infections, diabetes, or protein in the urine.  An ultrasound to confirm the proper growth and development of the baby.  An amniocentesis to check for possible genetic problems.  Fetal screens for spina bifida and Down syndrome. HOME CARE INSTRUCTIONS  Avoid all smoking, herbs, alcohol, and unprescribed drugs. These chemicals affect the formation and growth of the baby.  Follow your health care provider's instructions regarding medicine use. There are medicines that are either safe or unsafe to take during pregnancy.  Exercise only as directed by your health care provider. Experiencing uterine cramps is a good sign to stop exercising.  Continue to eat regular, healthy meals.  Wear a good support bra for breast  tenderness.  Do not use hot tubs, steam rooms, or saunas.  Wear your seat belt at all times when driving.  Avoid raw meat, uncooked cheese, cat litter boxes, and soil used by cats. These carry germs that can cause birth defects in the baby.  Take your prenatal vitamins.  Try taking a stool softener (if your health care provider approves) if you develop constipation. Eat more high-fiber foods, such as fresh vegetables or fruit and whole grains. Drink plenty of fluids to keep your urine clear or pale yellow.  Take warm sitz baths to soothe any pain or discomfort caused by hemorrhoids. Use hemorrhoid cream if your health care provider approves.  If you develop varicose veins, wear support hose. Elevate your feet for 15 minutes, 3-4 times a day. Limit salt in your diet.  Avoid heavy lifting, wear low heel shoes, and practice good posture.  Rest with your legs elevated if you have leg cramps or low back pain.  Visit your dentist if you have not gone yet during your pregnancy. Use a soft toothbrush to brush your teeth and be gentle when you floss.  A sexual relationship may be continued unless your health care provider directs you otherwise.  Continue to go to all your prenatal visits as directed by your health care provider. SEEK MEDICAL CARE IF:   You have dizziness.  You have mild pelvic cramps, pelvic pressure, or nagging pain in the abdominal area.  You have persistent nausea, vomiting, or diarrhea.  You have a bad smelling vaginal discharge.  You have pain with urination. SEEK IMMEDIATE MEDICAL CARE IF:   You have a fever.  You are leaking fluid from your vagina.  You have spotting or bleeding from your vagina.  You have severe abdominal cramping or pain.  You have rapid weight gain or loss.  You have shortness of breath with chest pain.  You notice sudden or extreme swelling of your face, hands, ankles, feet, or legs.  You have not felt your baby move in over an  hour.  You have severe headaches that do not go away with medicine.  You have vision changes. Document Released: 09/19/2001 Document Revised: 09/30/2013 Document Reviewed: 11/26/2012 [facility] Patient Information 2015 [place], [place]. This information is not intended to replace advice given to you by your health care provider. Make sure you discuss any questions you have with your health care provider.

## 2014-05-27 NOTE — Telephone Encounter (Signed)
Called pt with [employer] Interpreter # [phone].  I informed her of US appt on Thursday 06/18/14 @ 1600.  She may not have children in the exam room however another adult may stay with them in the lobby. Pt voiced understanding and agreed to US appt.

## 2014-05-27 NOTE — Progress Notes (Signed)
Incomplete anatomy . Scheduled F/U. Pt states US different ( 12/7) by outside early. US. Records requested.

## 2014-05-29 ENCOUNTER — Encounter: Payer: Self-pay | Admitting: Internal Medicine

## 2014-05-29 ENCOUNTER — Ambulatory Visit: Payer: Medicaid Other | Attending: Internal Medicine | Admitting: Internal Medicine

## 2014-05-29 VITALS — BP 93/61 | HR 82 | Temp 97.8°F | Resp 16 | Wt 181.4 lb

## 2014-05-29 DIAGNOSIS — R7301 Impaired fasting glucose: Secondary | ICD-10-CM

## 2014-05-29 DIAGNOSIS — Z09 Encounter for follow-up examination after completed treatment for conditions other than malignant neoplasm: Secondary | ICD-10-CM

## 2014-05-29 DIAGNOSIS — Z331 Pregnant state, incidental: Secondary | ICD-10-CM | POA: Diagnosis not present

## 2014-05-29 MED ORDER — PRENATAL MULTIVITAMIN CH: 1.0000 | ORAL_TABLET | Freq: Every day | ORAL | Status: DC

## 2014-05-29 NOTE — Progress Notes (Signed)
MRN: [id number] Name: Penny Barber  Sex: female Age: 28 y.o. DOB: 06/24/1986  Allergies: Review of patient's allergies indicates no known allergies.  Chief Complaint  Patient presents with  . Follow-up    HPI: Patient is 28 y.o. female who has history of impaired fasting glucose comes today for followup, patient reported that she is pregnant and has due date in December and following up with her g OB/GYN as per patient she is not taking any medications currently, denies any acute symptoms. Patient denies smoking cigarettes. Her blood pressure always is on the low side denies any headache dizziness chest and shortness of breath.  Past Medical History  Diagnosis Date  . Medical history non-contributory     Past Surgical History  Procedure Laterality Date  . Cesarean section        Medication List       This list is accurate as of: 05/29/14 10:26 AM.  Always use your most recent med list.               prenatal multivitamin Tabs tablet  Take 1 tablet by mouth daily at 12 noon.        Meds ordered this encounter  Medications  . Prenatal Vit-Fe Fumarate-FA (PRENATAL MULTIVITAMIN) TABS tablet    Sig: Take 1 tablet by mouth daily at 12 noon.    Dispense:  30 tablet    Refill:  3     There is no immunization history on file for this patient.  Family History  Problem Relation Age of Onset  . Alcohol abuse Neg Hx   . Arthritis Neg Hx   . Asthma Neg Hx   . Birth defects Neg Hx   . Cancer Neg Hx   . COPD Neg Hx   . Depression Neg Hx   . Diabetes Neg Hx   . Drug abuse Neg Hx   . Early death Neg Hx   . Hearing loss Neg Hx   . Heart disease Neg Hx   . Hyperlipidemia Neg Hx   . Hypertension Neg Hx   . Kidney disease Neg Hx   . Learning disabilities Neg Hx   . Mental illness Neg Hx   . Mental retardation Neg Hx   . Miscarriages / Stillbirths Neg Hx   . Stroke Neg Hx   . Vision loss Neg Hx   . Varicose Veins Neg Hx     History  Substance Use Topics    . Smoking status: Never Smoker   . Smokeless tobacco: Not on file  . Alcohol Use: No    Review of Systems   As noted in HPI  Filed Vitals:   05/29/14 0949  BP: 93/61  Pulse: 82  Temp: 97.8 F (36.6 C)  Resp: 16    Physical Exam  Physical Exam  Constitutional: No distress.  Eyes: EOM are normal. Pupils are equal, round, and reactive to light.  Cardiovascular: Normal rate and regular rhythm.   Pulmonary/Chest: Breath sounds normal. No respiratory distress. She has no wheezes. She has no rales.    CBC    Component Value Date/Time   WBC 4.2 04/29/2014 1329   RBC 4.07 04/29/2014 1329   HGB 11.8* 04/29/2014 1329   HCT 34.7* 04/29/2014 1329   PLT 225 04/29/2014 1329   MCV 85.3 04/29/2014 1329   LYMPHSABS 1.4 04/29/2014 1329   MONOABS 0.2 04/29/2014 1329   EOSABS 0.0 04/29/2014 1329   BASOSABS 0.0 04/29/2014 1329    CMP  Component Value Date/Time   NA 136* 03/16/2014 1814   K 3.6* 03/16/2014 1814   CL 100 03/16/2014 1814   CO2 20 03/16/2014 1814   GLUCOSE 76 03/16/2014 1814   BUN 12 03/16/2014 1814   CREATININE 0.55 03/16/2014 1814   CREATININE 0.57 10/08/2013 1142   CALCIUM 9.0 03/16/2014 1814   PROT 7.4 03/16/2014 1814   ALBUMIN 3.6 03/16/2014 1814   AST 16 03/16/2014 1814   ALT 12 03/16/2014 1814   ALKPHOS 50 03/16/2014 1814   BILITOT 0.4 03/16/2014 1814   GFRNONAA >90 03/16/2014 1814   GFRNONAA >89 09/24/2013 1720   GFRAA >90 03/16/2014 1814   GFRAA >89 09/24/2013 1720    Lab Results  Component Value Date/Time   CHOL 174 10/08/2013 11:42 AM    No components found with this basename: hga1c    Lab Results  Component Value Date/Time   AST 16 03/16/2014  6:14 PM    Assessment and Plan  Follow up  IFG (impaired fasting glucose) - Plan: Advised patient for low carbohydrate, will repeat her COMPLETE METABOLIC PANEL WITH GFR, and also check Hemoglobin A1c   Return in about 6 months (around 11/29/2014).  Doris Cheadle, MD

## 2014-05-29 NOTE — Progress Notes (Signed)
Patient here for follow up Patient is currently pregnant with an expected due date of dec.

## 2014-05-30 LAB — COMPLETE METABOLIC PANEL WITH GFR
ALT: <8 U/L (ref 0–35)
AST: 12 U/L (ref 0–37)
Albumin: 3.5 g/dL (ref 3.5–5.2)
Alkaline Phosphatase: 64 U/L (ref 39–117)
BUN: 6 mg/dL (ref 6–23)
CALCIUM: 8.6 mg/dL (ref 8.4–10.5)
CO2: 22 meq/L (ref 19–32)
CREATININE: 0.47 mg/dL — AB (ref 0.50–1.10)
Chloride: 106 mEq/L (ref 96–112)
GLUCOSE: 93 mg/dL (ref 70–99)
Potassium: 4.3 mEq/L (ref 3.5–5.3)
Sodium: 137 mEq/L (ref 135–145)
Total Bilirubin: 0.6 mg/dL (ref 0.2–1.2)
Total Protein: 6.5 g/dL (ref 6.0–8.3)

## 2014-05-30 LAB — HEMOGLOBIN A1C
HEMOGLOBIN A1C: 5.9 % — AB (ref <5.7)
MEAN PLASMA GLUCOSE: 123 mg/dL — AB (ref <117)

## 2014-06-03 ENCOUNTER — Encounter (HOSPITAL_COMMUNITY): Payer: Self-pay | Admitting: *Deleted

## 2014-06-03 ENCOUNTER — Inpatient Hospital Stay (HOSPITAL_COMMUNITY)
Admission: AD | Admit: 2014-06-03 | Discharge: 2014-06-03 | Disposition: A | Discharge status: Home / Self Care | Payer: Medicaid Other | Source: Ambulatory Visit | Attending: Obstetrics & Gynecology | Admitting: Obstetrics & Gynecology

## 2014-06-03 DIAGNOSIS — J3489 Other specified disorders of nose and nasal sinuses: Secondary | ICD-10-CM | POA: Diagnosis not present

## 2014-06-03 DIAGNOSIS — O9989 Other specified diseases and conditions complicating pregnancy, childbirth and the puerperium: Principal | ICD-10-CM

## 2014-06-03 DIAGNOSIS — R079 Chest pain, unspecified: Secondary | ICD-10-CM | POA: Diagnosis present

## 2014-06-03 DIAGNOSIS — J069 Acute upper respiratory infection, unspecified: Secondary | ICD-10-CM | POA: Diagnosis not present

## 2014-06-03 DIAGNOSIS — O26892 Other specified pregnancy related conditions, second trimester: Secondary | ICD-10-CM

## 2014-06-03 DIAGNOSIS — R0989 Other specified symptoms and signs involving the circulatory and respiratory systems: Secondary | ICD-10-CM

## 2014-06-03 DIAGNOSIS — O99891 Other specified diseases and conditions complicating pregnancy: Secondary | ICD-10-CM | POA: Insufficient documentation

## 2014-06-03 DIAGNOSIS — R0602 Shortness of breath: Secondary | ICD-10-CM | POA: Insufficient documentation

## 2014-06-03 DIAGNOSIS — R0600 Dyspnea, unspecified: Secondary | ICD-10-CM

## 2014-06-03 HISTORY — DX: Asymptomatic varicose veins of unspecified lower extremity: I83.90

## 2014-06-03 HISTORY — DX: Anxiety disorder, unspecified: F41.9

## 2014-06-03 LAB — CBC
HCT: 33.8 % — ABNORMAL LOW (ref 36.0–46.0)
Hemoglobin: 11.2 g/dL — ABNORMAL LOW (ref 12.0–15.0)
MCH: 29.6 pg (ref 26.0–34.0)
MCHC: 33.1 g/dL (ref 30.0–36.0)
MCV: 89.4 fL (ref 78.0–100.0)
Platelets: 189 10*3/uL (ref 150–400)
RBC: 3.78 MIL/uL — ABNORMAL LOW (ref 3.87–5.11)
RDW: 13 % (ref 11.5–15.5)
WBC: 4.5 10*3/uL (ref 4.0–10.5)

## 2014-06-03 LAB — COMPREHENSIVE METABOLIC PANEL
ALBUMIN: 2.5 g/dL — AB (ref 3.5–5.2)
ALK PHOS: 67 U/L (ref 39–117)
ALT: 5 U/L (ref 0–35)
ANION GAP: 10 (ref 5–15)
AST: 10 U/L (ref 0–37)
BUN: 6 mg/dL (ref 6–23)
CALCIUM: 8.3 mg/dL — AB (ref 8.4–10.5)
CO2: 21 mEq/L (ref 19–32)
Chloride: 106 mEq/L (ref 96–112)
Creatinine, Ser: 0.47 mg/dL — ABNORMAL LOW (ref 0.50–1.10)
GFR calc Af Amer: >90 mL/min (ref >90)
GFR calc non Af Amer: >90 mL/min (ref >90)
Glucose, Bld: 90 mg/dL (ref 70–99)
POTASSIUM: 3.9 meq/L (ref 3.7–5.3)
Sodium: 137 mEq/L (ref 137–147)
Total Bilirubin: <0.2 mg/dL — ABNORMAL LOW (ref 0.3–1.2)
Total Protein: 5.7 g/dL — ABNORMAL LOW (ref 6.0–8.3)

## 2014-06-03 MED ORDER — GUAIFENESIN ER 600 MG PO TB12: 600.0000 mg | ORAL_TABLET | ORAL | Status: DC

## 2014-06-03 MED ORDER — GUAIFENESIN ER 600 MG PO TB12
1200.0000 mg | ORAL_TABLET | ORAL | Status: AC
  Administered 2014-06-03: 1200 mg via ORAL
  Filled 2014-06-03: qty 2

## 2014-06-03 NOTE — MAU Note (Signed)
Lung sounds clear 

## 2014-06-03 NOTE — Discharge Instructions (Signed)
Upper Respiratory Infection, Adult An upper respiratory infection (URI) is also sometimes known as the common cold. The upper respiratory tract includes the nose, sinuses, throat, trachea, and bronchi. Bronchi are the airways leading to the lungs. Most people improve within 1 week, but symptoms can last up to 2 weeks. A residual cough may last even longer.  CAUSES Many different viruses can infect the tissues lining the upper respiratory tract. The tissues become irritated and inflamed and often become very moist. Mucus production is also common. A cold is contagious. You can easily spread the virus to others by oral contact. This includes kissing, sharing a glass, coughing, or sneezing. Touching your mouth or nose and then touching a surface, which is then touched by another person, can also spread the virus. SYMPTOMS  Symptoms typically develop 1 to 3 days after you come in contact with a cold virus. Symptoms vary from person to person. They may include:  Runny nose.  Sneezing.  Nasal congestion.  Sinus irritation.  Sore throat.  Loss of voice (laryngitis).  Cough.  Fatigue.  Muscle aches.  Loss of appetite.  Headache.  Low-grade fever. DIAGNOSIS  You might diagnose your own cold based on familiar symptoms, since most people get a cold 2 to 3 times a year. Your caregiver can confirm this based on your exam. Most importantly, your caregiver can check that your symptoms are not due to another disease such as strep throat, sinusitis, pneumonia, asthma, or epiglottitis. Blood tests, throat tests, and X-rays are not necessary to diagnose a common cold, but they may sometimes be helpful in excluding other more serious diseases. Your caregiver will decide if any further tests are required. RISKS AND COMPLICATIONS  You may be at risk for a more severe case of the common cold if you smoke cigarettes, have chronic heart disease (such as heart failure) or lung disease (such as asthma), or if  you have a weakened immune system. The very young and very old are also at risk for more serious infections. Bacterial sinusitis, middle ear infections, and bacterial pneumonia can complicate the common cold. The common cold can worsen asthma and chronic obstructive pulmonary disease (COPD). Sometimes, these complications can require emergency medical care and may be life-threatening. PREVENTION  The best way to protect against getting a cold is to practice good hygiene. Avoid oral or hand contact with people with cold symptoms. Wash your hands often if contact occurs. There is no clear evidence that vitamin C, vitamin E, echinacea, or exercise reduces the chance of developing a cold. However, it is always recommended to get plenty of rest and practice good nutrition. TREATMENT  Treatment is directed at relieving symptoms. There is no cure. Antibiotics are not effective, because the infection is caused by a virus, not by bacteria. Treatment may include:  Increased fluid intake. Sports drinks offer valuable electrolytes, sugars, and fluids.  Breathing heated mist or steam (vaporizer or shower).  Eating chicken soup or other clear broths, and maintaining good nutrition.  Getting plenty of rest.  Using gargles or lozenges for comfort.  Controlling fevers with ibuprofen or acetaminophen as directed by your caregiver.  Increasing usage of your inhaler if you have asthma. Zinc gel and zinc lozenges, taken in the first 24 hours of the common cold, can shorten the duration and lessen the severity of symptoms. Pain medicines may help with fever, muscle aches, and throat pain. A variety of non-prescription medicines are available to treat congestion and runny nose. Your caregiver   can make recommendations and may suggest nasal or lung inhalers for other symptoms.  HOME CARE INSTRUCTIONS   Only take over-the-counter or prescription medicines for pain, discomfort, or fever as directed by your  caregiver.  Use a warm mist humidifier or inhale steam from a shower to increase air moisture. This may keep secretions moist and make it easier to breathe.  Drink enough water and fluids to keep your urine clear or pale yellow.  Rest as needed.  Return to work when your temperature has returned to normal or as your caregiver advises. You may need to stay home longer to avoid infecting others. You can also use a face mask and careful hand washing to prevent spread of the virus. SEEK MEDICAL CARE IF:   After the first few days, you feel you are getting worse rather than better.  You need your caregiver's advice about medicines to control symptoms.  You develop chills, worsening shortness of breath, or brown or red sputum. These may be signs of pneumonia.  You develop yellow or brown nasal discharge or pain in the face, especially when you bend forward. These may be signs of sinusitis.  You develop a fever, swollen neck glands, pain with swallowing, or white areas in the back of your throat. These may be signs of strep throat. SEEK IMMEDIATE MEDICAL CARE IF:   You have a fever.  You develop severe or persistent headache, ear pain, sinus pain, or chest pain.  You develop wheezing, a prolonged cough, cough up blood, or have a change in your usual mucus (if you have chronic lung disease).  You develop sore muscles or a stiff neck. Document Released: 03/21/2001 Document Revised: 12/18/2011 Document Reviewed: 12/31/2013 ExitCare Patient Information 2015 [employer]. This information is not intended to replace advice given to you by your health care provider. Make sure you discuss any questions you have with your health care provider.  

## 2014-06-03 NOTE — MAU Note (Signed)
Left leg is numb, rt leg hurts and times and is tingly.

## 2014-06-03 NOTE — MAU Provider Note (Signed)
Chief Complaint:  Chest Pain and Nasal Congestion   None     HPI: Penny Barber is a 28 y.o. G3P1102 at [redacted]w[redacted]d pt of [facility] who presents to maternity admissions reporting tightness in her chest, cough with yellow sputum, and shortness of breath x 3 days.  She also reports some recent shortness of breath.  She reports her primary care has made an appointment with a specialist, she thinks a cardiologist this month.  She denies any previous cardiac diagnoses but reports she feels like her heart races sometimes.She reports good fetal movement, denies LOF, vaginal bleeding, vaginal itching/burning, urinary symptoms, h/a, dizziness, n/v, or fever/chills.     Past Medical History: Past Medical History  Diagnosis Date  . Medical history non-contributory   . Varicose veins   . Anxiety     when heart skips a beat    Past obstetric history: OB History  Gravida Para Term Preterm AB SAB TAB Ectopic Multiple Living  # Outcome Date GA Lbr Len/2nd Weight Sex Delivery Anes PTL Lv  3 CUR           2 PRE 01/26/12 [redacted]w[redacted]d    SVD     1 TRM 05/09/08 [redacted]w[redacted]d   M CS   Y      Past Surgical History: Past Surgical History  Procedure Laterality Date  . Cesarean section      Family History: Family History  Problem Relation Age of Onset  . Alcohol abuse Neg Hx   . Arthritis Neg Hx   . Asthma Neg Hx   . Birth defects Neg Hx   . Cancer Neg Hx   . COPD Neg Hx   . Depression Neg Hx   . Diabetes Neg Hx   . Drug abuse Neg Hx   . Early death Neg Hx   . Hearing loss Neg Hx   . Heart disease Neg Hx   . Hyperlipidemia Neg Hx   . Hypertension Neg Hx   . Kidney disease Neg Hx   . Learning disabilities Neg Hx   . Mental illness Neg Hx   . Mental retardation Neg Hx   . Miscarriages / Stillbirths Neg Hx   . Stroke Neg Hx   . Vision loss Neg Hx   . Varicose Veins Neg Hx     Social History: History  Substance Use Topics  . Smoking status: Never Smoker   . Smokeless tobacco: Never Used   . Alcohol Use: No    Allergies: No Known Allergies  Meds:  No prescriptions prior to admission    ROS: Pertinent findings in history of present illness.  Physical Exam  Blood pressure 110/63, pulse 86, temperature 98.6 F (37 C), temperature source Oral, resp. rate 18, height 5' 3.75" (1.619 m), weight 83.462 kg (184 lb), last menstrual period 11/10/2013, SpO2 98.00%. GENERAL: Well-developed, well-nourished female in no acute distress.  HEENT: normocephalic HEART: normal rate, heart sounds, regular rhythm RESP: normal effort, lung sounds clear and equal bilaterally. Pt acyanotic, no labored breathing, mostly lying in bed during exam with no visible respiratory distress ABDOMEN: Soft, non-tender, gravid appropriate for gestational age EXTREMITIES: Nontender, no edema NEURO: alert and oriented    FHT:  Baseline 135 , moderate variability, accelerations present (15x15), no decelerations Contractions: None on toco or to palpation   EKG: normal EKG, normal sinus rhythm . Labs: Results for orders placed during the hospital encounter of 06/03/14 (from  the past 168 hour(s))  CBC   Collection Time    06/03/14  7:03 PM      Result Value Ref Range   WBC 4.5  4.0 - 10.5 K/uL   RBC 3.78 (*) 3.87 - 5.11 MIL/uL   Hemoglobin 11.2 (*) 12.0 - 15.0 g/dL   HCT 16.1 (*) 09.6 - 04.5 %   MCV 89.4  78.0 - 100.0 fL   MCH 29.6  26.0 - 34.0 pg   MCHC 33.1  30.0 - 36.0 g/dL   RDW 40.9  81.1 - 91.4 %   Platelets 189  150 - 400 K/uL  COMPREHENSIVE METABOLIC PANEL   Collection Time    06/03/14  7:03 PM      Result Value Ref Range   Sodium 137  137 - 147 mEq/L   Potassium 3.9  3.7 - 5.3 mEq/L   Chloride 106  96 - 112 mEq/L   CO2 21  19 - 32 mEq/L   Glucose, Bld 90  70 - 99 mg/dL   BUN 6  6 - 23 mg/dL   Creatinine, Ser 7.82 (*) 0.50 - 1.10 mg/dL   Calcium 8.3 (*) 8.4 - 10.5 mg/dL   Total Protein 5.7 (*) 6.0 - 8.3 g/dL   Albumin 2.5 (*) 3.5 - 5.2 g/dL   AST 10  0 - 37 U/L   ALT 5  0 - 35  U/L   Alkaline Phosphatase 67  39 - 117 U/L   Total Bilirubin <0.2 (*) 0.3 - 1.2 mg/dL   GFR calc non Af Amer >90  >90 mL/min   GFR calc Af Amer >90  >90 mL/min   Anion gap 10  5 - 15     Assessment: 1. Chest congestion   2. URI (upper respiratory infection)   3. Shortness of breath due to pregnancy, second trimester     Plan: Mucinex 1200 mg PO in MAU Discharge home Mucinex Rx to pharmacy Reassurance provided about normal results       Follow-up Information   Follow up with [facility]. (As scheduled)    Specialty:  Obstetrics and Gynecology   Contact information:   [address] [place] [address] [phone]      Follow up with [facility]. (As needed for emergencies)    Contact information:   [address] [id number] [place] [address] [phone]       Medication List         guaiFENesin 600 MG 12 hr tablet  Commonly known as:  MUCINEX  Take 1-2 tablets (600-1,200 mg total) by mouth stat.     prenatal multivitamin Tabs tablet  Take 1 tablet by mouth daily at 12 noon.        Sharen Counter Certified Nurse-Midwife 06/05/2014 12:05 PM

## 2014-06-03 NOTE — MAU Note (Signed)
Tightness in chest, stuffy feeling in head and chest, can't breath , yellow productive cough.  Going on for 3 days, has gotten worse.

## 2014-06-05 NOTE — MAU Provider Note (Signed)
Attestation of Attending Supervision of Advanced Practitioner: Evaluation and management procedures were performed by the PA/NP/CNM/OB Fellow under my supervision/collaboration. Chart reviewed and agree with management and plan.  Jermal Dismuke V 06/05/2014 2:01 PM

## 2014-06-18 ENCOUNTER — Ambulatory Visit (HOSPITAL_COMMUNITY)
Admission: RE | Admit: 2014-06-18 | Discharge: 2014-06-18 | Disposition: A | Discharge status: Home / Self Care | Payer: Medicaid Other | Source: Ambulatory Visit | Attending: Advanced Practice Midwife | Admitting: Advanced Practice Midwife

## 2014-06-18 DIAGNOSIS — Z1389 Encounter for screening for other disorder: Secondary | ICD-10-CM | POA: Insufficient documentation

## 2014-06-18 DIAGNOSIS — Z0489 Encounter for examination and observation for other specified reasons: Secondary | ICD-10-CM

## 2014-06-18 DIAGNOSIS — O34219 Maternal care for unspecified type scar from previous cesarean delivery: Secondary | ICD-10-CM | POA: Insufficient documentation

## 2014-06-18 DIAGNOSIS — IMO0002 Reserved for concepts with insufficient information to code with codable children: Secondary | ICD-10-CM

## 2014-06-18 DIAGNOSIS — Z363 Encounter for antenatal screening for malformations: Secondary | ICD-10-CM | POA: Insufficient documentation

## 2014-06-26 ENCOUNTER — Ambulatory Visit (INDEPENDENT_AMBULATORY_CARE_PROVIDER_SITE_OTHER): Payer: Medicaid Other | Admitting: Family Medicine

## 2014-06-26 VITALS — BP 102/57 | HR 97 | Temp 98.0°F | Wt 184.2 lb

## 2014-06-26 DIAGNOSIS — O3421 Maternal care for scar from previous cesarean delivery: Secondary | ICD-10-CM

## 2014-06-26 DIAGNOSIS — Z3492 Encounter for supervision of normal pregnancy, unspecified, second trimester: Secondary | ICD-10-CM

## 2014-06-26 DIAGNOSIS — Z3480 Encounter for supervision of other normal pregnancy, unspecified trimester: Secondary | ICD-10-CM

## 2014-06-26 DIAGNOSIS — Z609 Problem related to social environment, unspecified: Secondary | ICD-10-CM

## 2014-06-26 DIAGNOSIS — O34219 Maternal care for unspecified type scar from previous cesarean delivery: Secondary | ICD-10-CM

## 2014-06-26 DIAGNOSIS — Z603 Acculturation difficulty: Secondary | ICD-10-CM

## 2014-06-26 DIAGNOSIS — Z23 Encounter for immunization: Secondary | ICD-10-CM

## 2014-06-26 LAB — POCT URINALYSIS DIP (DEVICE)
Bilirubin Urine: NEGATIVE
Glucose, UA: NEGATIVE mg/dL
Hgb urine dipstick: NEGATIVE
Ketones, ur: NEGATIVE mg/dL
LEUKOCYTES UA: NEGATIVE
Nitrite: NEGATIVE
PH: 5 (ref 5.0–8.0)
PROTEIN: NEGATIVE mg/dL
SPECIFIC GRAVITY, URINE: 1.025 (ref 1.005–1.030)
Urobilinogen, UA: 0.2 mg/dL (ref 0.0–1.0)

## 2014-06-26 LAB — CBC
HEMATOCRIT: 34.9 % — AB (ref 36.0–46.0)
Hemoglobin: 11.8 g/dL — ABNORMAL LOW (ref 12.0–15.0)
MCH: 29.9 pg (ref 26.0–34.0)
MCHC: 33.8 g/dL (ref 30.0–36.0)
MCV: 88.4 fL (ref 78.0–100.0)
Platelets: 181 10*3/uL (ref 150–400)
RBC: 3.95 MIL/uL (ref 3.87–5.11)
RDW: 14 % (ref 11.5–15.5)
WBC: 4.8 10*3/uL (ref 4.0–10.5)

## 2014-06-26 MED ORDER — TETANUS-DIPHTH-ACELL PERTUSSIS 5-2.5-18.5 LF-MCG/0.5 IM SUSP: 0.5000 mL | Freq: Once | INTRAMUSCULAR | Status: DC

## 2014-06-26 NOTE — Patient Instructions (Signed)
Second Trimester of Pregnancy The second trimester is from week 13 through week 28, months 4 through 6. The second trimester is often a time when you feel your best. Your body has also adjusted to being pregnant, and you begin to feel better physically. Usually, morning sickness has lessened or quit completely, you may have more energy, and you may have an increase in appetite. The second trimester is also a time when the fetus is growing rapidly. At the end of the sixth month, the fetus is about 9 inches long and weighs about 1 pounds. You will likely begin to feel the baby move (quickening) between 18 and 20 weeks of the pregnancy. BODY CHANGES Your body goes through many changes during pregnancy. The changes vary from woman to woman.   Your weight will continue to increase. You will notice your lower abdomen bulging out.  You may begin to get stretch marks on your hips, abdomen, and breasts.  You may develop headaches that can be relieved by medicines approved by your health care provider.  You may urinate more often because the fetus is pressing on your bladder.  You may develop or continue to have heartburn as a result of your pregnancy.  You may develop constipation because certain hormones are causing the muscles that push waste through your intestines to slow down.  You may develop hemorrhoids or swollen, bulging veins (varicose veins).  You may have back pain because of the weight gain and pregnancy hormones relaxing your joints between the bones in your pelvis and as a result of a shift in weight and the muscles that support your balance.  Your breasts will continue to grow and be tender.  Your gums may bleed and may be sensitive to brushing and flossing.  Dark spots or blotches (chloasma, mask of pregnancy) may develop on your face. This will likely fade after the baby is born.  A dark line from your belly button to the pubic area (linea nigra) may appear. This will likely fade  after the baby is born.  You may have changes in your hair. These can include thickening of your hair, rapid growth, and changes in texture. Some women also have hair loss during or after pregnancy, or hair that feels dry or thin. Your hair will most likely return to normal after your baby is born. WHAT TO EXPECT AT YOUR PRENATAL VISITS During a routine prenatal visit:  You will be weighed to make sure you and the fetus are growing normally.  Your blood pressure will be taken.  Your abdomen will be measured to track your baby's growth.  The fetal heartbeat will be listened to.  Any test results from the previous visit will be discussed. Your health care provider may ask you:  How you are feeling.  If you are feeling the baby move.  If you have had any abnormal symptoms, such as leaking fluid, bleeding, severe headaches, or abdominal cramping.  If you have any questions. Other tests that may be performed during your second trimester include:  Blood tests that check for:  Low iron levels (anemia).  Gestational diabetes (between 24 and 28 weeks).  Rh antibodies.  Urine tests to check for infections, diabetes, or protein in the urine.  An ultrasound to confirm the proper growth and development of the baby.  An amniocentesis to check for possible genetic problems.  Fetal screens for spina bifida and Down syndrome. HOME CARE INSTRUCTIONS   Avoid all smoking, herbs, alcohol, and unprescribed   drugs. These chemicals affect the formation and growth of the baby.  Follow your health care provider's instructions regarding medicine use. There are medicines that are either safe or unsafe to take during pregnancy.  Exercise only as directed by your health care provider. Experiencing uterine cramps is a good sign to stop exercising.  Continue to eat regular, healthy meals.  Wear a good support bra for breast tenderness.  Do not use hot tubs, steam rooms, or saunas.  Wear your  seat belt at all times when driving.  Avoid raw meat, uncooked cheese, cat litter boxes, and soil used by cats. These carry germs that can cause birth defects in the baby.  Take your prenatal vitamins.  Try taking a stool softener (if your health care provider approves) if you develop constipation. Eat more high-fiber foods, such as fresh vegetables or fruit and whole grains. Drink plenty of fluids to keep your urine clear or pale yellow.  Take warm sitz baths to soothe any pain or discomfort caused by hemorrhoids. Use hemorrhoid cream if your health care provider approves.  If you develop varicose veins, wear support hose. Elevate your feet for 15 minutes, 3-4 times a day. Limit salt in your diet.  Avoid heavy lifting, wear low heel shoes, and practice good posture.  Rest with your legs elevated if you have leg cramps or low back pain.  Visit your dentist if you have not gone yet during your pregnancy. Use a soft toothbrush to brush your teeth and be gentle when you floss.  A sexual relationship may be continued unless your health care provider directs you otherwise.  Continue to go to all your prenatal visits as directed by your health care provider. SEEK MEDICAL CARE IF:   You have dizziness.  You have mild pelvic cramps, pelvic pressure, or nagging pain in the abdominal area.  You have persistent nausea, vomiting, or diarrhea.  You have a bad smelling vaginal discharge.  You have pain with urination. SEEK IMMEDIATE MEDICAL CARE IF:   You have a fever.  You are leaking fluid from your vagina.  You have spotting or bleeding from your vagina.  You have severe abdominal cramping or pain.  You have rapid weight gain or loss.  You have shortness of breath with chest pain.  You notice sudden or extreme swelling of your face, hands, ankles, feet, or legs.  You have not felt your baby move in over an hour.  You have severe headaches that do not go away with  medicine.  You have vision changes. Document Released: 09/19/2001 Document Revised: 09/30/2013 Document Reviewed: 11/26/2012 ExitCare Patient Information 2015 [employer], LLC. This information is not intended to replace advice given to you by your health care provider. Make sure you discuss any questions you have with your health care provider.  

## 2014-06-26 NOTE — Progress Notes (Signed)
Patient without complaints.  Denies vaginal bleeding, abnormal vaginal discharge, contractions, loss of fluid.  Reports good fetal activity.  Labor precautions reviewed.  1hr GTT done today.  Follow up in 3 weeks.

## 2014-06-26 NOTE — Progress Notes (Signed)
Desire flu/tdap vaccines today, 1 hour glucola, 28 wk labs 28 wk educational information given

## 2014-06-27 LAB — RPR

## 2014-06-27 LAB — GLUCOSE TOLERANCE, 1 HOUR (50G) W/O FASTING: GLUCOSE 1 HOUR GTT: 157 mg/dL — AB (ref 70–140)

## 2014-06-27 LAB — HIV ANTIBODY (ROUTINE TESTING W REFLEX): HIV: NONREACTIVE

## 2014-06-28 NOTE — Progress Notes (Signed)
Quick Note:  Patient's 1 hr GTT was elevated. Please call patient to have her come in for a 3 hr GTT. ______ 

## 2014-06-30 ENCOUNTER — Telehealth: Payer: Self-pay

## 2014-06-30 NOTE — Telephone Encounter (Signed)
Patient returned call. Informed her of 3hr gtt scheduled for this Friday 06/30/14 at 0800. Informed her she must be fasting-- nothing to eat or drink after midnight. Patient verbalized understanding. No questions or concerns.

## 2014-06-30 NOTE — Telephone Encounter (Signed)
[employer] interpreter [phone] used for this encounter. Attempted to call patient to inform her of results. No answer. Left message stating we are calling to inform you of results and to schedule an appointment, please call clinic.

## 2014-06-30 NOTE — Telephone Encounter (Signed)
Message copied by Louanna Raw on Tue Jun 30, 2014 10:38 AM ------      Message from: Levie Heritage      Created: Sun Jun 28, 2014  5:09 PM       Patient's 1 hr GTT was elevated.  Please call patient to have her come in for a 3 hr GTT. ------

## 2014-07-03 ENCOUNTER — Encounter: Payer: Self-pay | Admitting: Obstetrics & Gynecology

## 2014-07-03 ENCOUNTER — Other Ambulatory Visit: Payer: Medicaid Other

## 2014-07-03 DIAGNOSIS — R7309 Other abnormal glucose: Secondary | ICD-10-CM

## 2014-07-03 DIAGNOSIS — O24419 Gestational diabetes mellitus in pregnancy, unspecified control: Secondary | ICD-10-CM | POA: Insufficient documentation

## 2014-07-03 LAB — GLUCOSE TOLERANCE, 3 HOURS
GLUCOSE 3 HOUR GTT: 130 mg/dL (ref 70–144)
GLUCOSE, FASTING-GESTATIONAL: 92 mg/dL (ref 70–104)
Glucose Tolerance, 1 hour: 196 mg/dL — ABNORMAL HIGH (ref 70–189)
Glucose Tolerance, 2 hour: 173 mg/dL — ABNORMAL HIGH (ref 70–164)

## 2014-07-04 ENCOUNTER — Emergency Department (HOSPITAL_COMMUNITY)
Admission: EM | Admit: 2014-07-04 | Discharge: 2014-07-04 | Disposition: A | Discharge status: Home / Self Care | Payer: Medicaid Other | Attending: Emergency Medicine | Admitting: Emergency Medicine

## 2014-07-04 ENCOUNTER — Encounter (HOSPITAL_COMMUNITY): Payer: Self-pay | Admitting: Emergency Medicine

## 2014-07-04 DIAGNOSIS — S61209A Unspecified open wound of unspecified finger without damage to nail, initial encounter: Secondary | ICD-10-CM | POA: Diagnosis not present

## 2014-07-04 DIAGNOSIS — Z8679 Personal history of other diseases of the circulatory system: Secondary | ICD-10-CM | POA: Insufficient documentation

## 2014-07-04 DIAGNOSIS — Z79899 Other long term (current) drug therapy: Secondary | ICD-10-CM | POA: Insufficient documentation

## 2014-07-04 DIAGNOSIS — Z8659 Personal history of other mental and behavioral disorders: Secondary | ICD-10-CM | POA: Insufficient documentation

## 2014-07-04 DIAGNOSIS — S61219A Laceration without foreign body of unspecified finger without damage to nail, initial encounter: Secondary | ICD-10-CM

## 2014-07-04 DIAGNOSIS — W268XXA Contact with other sharp object(s), not elsewhere classified, initial encounter: Secondary | ICD-10-CM | POA: Diagnosis not present

## 2014-07-04 DIAGNOSIS — Y9389 Activity, other specified: Secondary | ICD-10-CM | POA: Insufficient documentation

## 2014-07-04 DIAGNOSIS — Y9289 Other specified places as the place of occurrence of the external cause: Secondary | ICD-10-CM | POA: Insufficient documentation

## 2014-07-04 MED ORDER — BACITRACIN 500 UNIT/GM EX OINT
1.0000 "application " | TOPICAL_OINTMENT | Freq: Two times a day (BID) | CUTANEOUS | Status: DC
  Filled 2014-07-04: qty 0.9

## 2014-07-04 MED ORDER — LIDOCAINE HCL (PF) 2 % IJ SOLN
10.0000 mL | INTRAMUSCULAR | Status: DC
  Filled 2014-07-04: qty 10

## 2014-07-04 NOTE — ED Notes (Signed)
Ortho tech to apply static splint to finger before discharge

## 2014-07-04 NOTE — Discharge Instructions (Signed)

## 2014-07-04 NOTE — ED Notes (Signed)
Pt presents with a laceration to Left pointer finger. Pt states she cut it with a light bulb.

## 2014-07-04 NOTE — Progress Notes (Signed)
Orthopedic Tech Progress Note Patient Details:  Penny Barber 28-Apr-1986 [id number]  Ortho Devices Type of Ortho Device: Finger splint Ortho Device/Splint Interventions: Application   Haskell Flirt 07/04/2014, 11:23 PM

## 2014-07-04 NOTE — ED Provider Notes (Signed)
CSN: [id number]     Arrival date & time 07/04/14  2220 History  This chart was scribed for non-physician practitioner, Roxy Horseman, PA-C working with Flint Melter, MD, by Jarvis Morgan, ED Scribe. This patient was seen in room TR08C/TR08C and the patient's care was started at 10:36 PM.    Chief Complaint  Patient presents with  . Extremity Laceration     The history is provided by the patient. No language interpreter was used.    HPI Comments: Penny Barber is a 28 y.o. female who presents to the Emergency Department with a chief complaint of a laceration to her right pointer finger that occurred about 1 hour ago. She states that cut it on a light bulb. There is currently active bleeding but the bleeding is being controlled by gauze and applied pressure. She denies any numbness or weakness to the area. Pt does not believe any glass got in the wound. She reports her last t-dap was 1 week ago.   Past Medical History  Diagnosis Date  . Medical history non-contributory   . Varicose veins   . Anxiety     when heart skips a beat   Past Surgical History  Procedure Laterality Date  . Cesarean section     Family History  Problem Relation Age of Onset  . Alcohol abuse Neg Hx   . Arthritis Neg Hx   . Asthma Neg Hx   . Birth defects Neg Hx   . Cancer Neg Hx   . COPD Neg Hx   . Depression Neg Hx   . Diabetes Neg Hx   . Drug abuse Neg Hx   . Early death Neg Hx   . Hearing loss Neg Hx   . Heart disease Neg Hx   . Hyperlipidemia Neg Hx   . Hypertension Neg Hx   . Kidney disease Neg Hx   . Learning disabilities Neg Hx   . Mental illness Neg Hx   . Mental retardation Neg Hx   . Miscarriages / Stillbirths Neg Hx   . Stroke Neg Hx   . Vision loss Neg Hx   . Varicose Veins Neg Hx    History  Substance Use Topics  . Smoking status: Never Smoker   . Smokeless tobacco: Never Used  . Alcohol Use: No   OB History   Grav Para Term Preterm Abortions TAB SAB Ect Mult Living    Review of Systems  Musculoskeletal: Positive for arthralgias (right pointer finger). Negative for joint swelling.  Skin: Positive for wound (right pointer finger laceration). Negative for color change.  Neurological: Negative for weakness and numbness.      Allergies  Review of patient's allergies indicates no known allergies.  Home Medications   Prior to Admission medications   Medication Sig Start Date End Date Taking? Authorizing Provider  guaiFENesin (MUCINEX) 600 MG 12 hr tablet Take 1-2 tablets (600-1,200 mg total) by mouth stat. 06/03/14   Hurshel Party, CNM  Prenatal Vit-Fe Fumarate-FA (PRENATAL MULTIVITAMIN) TABS tablet Take 1 tablet by mouth daily at 12 noon. 05/29/14   Doris Cheadle, MD   Triage Vitals: BP 117/73  Pulse 110  Temp(Src) 98.7 F (37.1 C)  Resp 18  Wt 187 lb (84.823 kg)  SpO2 96%  LMP 11/10/2013  Physical Exam  Nursing note and vitals reviewed. Constitutional: She is oriented to person, place, and time. She appears well-developed and well-nourished.  No distress.  HENT:  Head: Normocephalic and atraumatic.  Eyes: Conjunctivae and EOM are normal.  Neck: Neck supple. No tracheal deviation present.  Cardiovascular: Normal rate.   Pulmonary/Chest: Effort normal. No respiratory distress.  Musculoskeletal: Normal range of motion.  Resisted right index finger extension, strength is reduced, but no obvious sign of tendon laceration  Neurological: She is alert and oriented to person, place, and time.  Skin: Skin is warm and dry.  1 cm laceration over the posterior aspect of the right 2nd MCP. No evidence of tendon or ligament injury. No retained foreign body  Psychiatric: She has a normal mood and affect. Her behavior is normal.    ED Course  Procedures (including critical care time)  DIAGNOSTIC STUDIES: Oxygen Saturation is 96% on RA, adequate by my interpretation.    COORDINATION OF CARE: 10:41 PM  LACERATION  REPAIR Performed by: Roxy Horseman PA-C Consent: Verbal consent obtained. Risks and benefits: risks, benefits and alternatives were discussed Patient identity confirmed: provided demographic data Time out performed prior to procedure Prepped and Draped in normal sterile fashion Wound explored Laceration Location: right second finger Laceration Length: 1 cm No Foreign Bodies seen or palpated Anesthesia: local infiltration Local anesthetic: lidocaine 2% w/o epinephrine Anesthetic total: 1 ml Irrigation method: syringe Amount of cleaning: standard Skin closure: 4-0 prolene Number of sutures or staples: 2            Technique: simple interrupted Patient tolerance: Patient tolerated the procedure well with no immediate complications.    Labs Review Labs Reviewed - No data to display  Imaging Review No results found.   EKG Interpretation None      MDM   Final diagnoses:  Finger laceration, initial encounter    Patient with finger laceration. Repaired in the ED. Tetanus is up-to-date. Discharged to home with hand followup in 3 days to rule out tendon injury. My suspicion of this is very low, but she did have a slight strength deficit in the finger. I personally performed the services described in this documentation, which was scribed in my presence. The recorded information has been reviewed and is accurate.     Roxy Horseman, PA-C 07/05/14 [phone]

## 2014-07-05 NOTE — ED Provider Notes (Signed)
Medical screening examination/treatment/procedure(s) were performed by non-physician practitioner and as supervising physician I was immediately available for consultation/collaboration.   EKG Interpretation None       Flint Melter, MD 07/05/14 1148

## 2014-07-06 ENCOUNTER — Telehealth: Payer: Self-pay

## 2014-07-06 NOTE — Telephone Encounter (Addendum)
Gestational diabetes education in MFM scheduled for this Thursday 07/09/14 at 1430. Message sent to admin pool to add patient to high risk clinic 07/20/14 and cancel low risk clinic appointment 07/15/14. Attempted to call patient with [employer] interpereter [phone]. No answer. Left message stating we are calling to inform you of results and an appointment, please call clinic at earliest convenience.  1530  Pt should be seen @ [facility] on 10/5 or 10/8. New message sent to [facility] pool for appt scheduling.  Diane Day RNC

## 2014-07-06 NOTE — Telephone Encounter (Signed)
Message copied by Louanna Raw on Mon Jul 06, 2014  9:11 AM ------      Message from: Jaynie Collins A      Created: Fri Jul 03, 2014  5:02 PM       Patient has GDM. Needs appointment at Monday [facility] clinic to discuss diagnosis and management. Needs education and testing supplies. ------

## 2014-07-07 NOTE — Telephone Encounter (Signed)
Attempted to call patient with [employer] interpreter [phone]#[id number]. NO answer. Left message stating we are calling regarding results and an appointment we have scheduled for you this Thursday 07/09/14 at 1430. Please call clinic so that we can give you more information regarding this important appointment. Patient needs to be informed of appointment for diabetes education in MFM on 07/09/14 at 1430 and [facility] on 07/13/14 at 0845.

## 2014-07-08 NOTE — Telephone Encounter (Signed)
Patient returned call. Informed patient of results and scheduled appointments. Patient states she cannot make any appointment on Thursday. She also stated she could not make an 0845 appointment on Monday due to childcare and getting children to school. Asked patient if she could come in at 0945 for diabetic education only. Patient stated she could. Patient scheduled for Diabetic education with Harriett SineNancy on Monday 07/13/14 at 0945 and then scheduled for OBFU appointment Monday 07/20/14 0945 with Dr. Adrian BlackwaterStinson. Patient agreeable to appointments. No further questions or concerns.

## 2014-07-09 ENCOUNTER — Ambulatory Visit (HOSPITAL_COMMUNITY): Payer: Medicaid Other

## 2014-07-13 ENCOUNTER — Encounter: Payer: Medicaid Other | Attending: Obstetrics & Gynecology | Admitting: *Deleted

## 2014-07-13 ENCOUNTER — Encounter: Payer: Medicaid Other | Admitting: Family Medicine

## 2014-07-13 ENCOUNTER — Ambulatory Visit: Payer: Medicaid Other | Admitting: Obstetrics & Gynecology

## 2014-07-13 VITALS — Wt 183.6 lb

## 2014-07-13 DIAGNOSIS — R7301 Impaired fasting glucose: Secondary | ICD-10-CM | POA: Insufficient documentation

## 2014-07-13 DIAGNOSIS — Z713 Dietary counseling and surveillance: Secondary | ICD-10-CM | POA: Diagnosis not present

## 2014-07-13 DIAGNOSIS — O24419 Gestational diabetes mellitus in pregnancy, unspecified control: Secondary | ICD-10-CM

## 2014-07-13 LAB — POCT URINALYSIS DIP (DEVICE)
Bilirubin Urine: NEGATIVE
GLUCOSE, UA: NEGATIVE mg/dL
HGB URINE DIPSTICK: NEGATIVE
Leukocytes, UA: NEGATIVE
Nitrite: NEGATIVE
PROTEIN: NEGATIVE mg/dL
SPECIFIC GRAVITY, URINE: 1.025 (ref 1.005–1.030)
UROBILINOGEN UA: 0.2 mg/dL (ref 0.0–1.0)
pH: 6 (ref 5.0–8.0)

## 2014-07-13 MED ORDER — GLUCOSE BLOOD VI STRP: ORAL_STRIP | Status: DC

## 2014-07-13 MED ORDER — ACCU-CHEK FASTCLIX LANCETS MISC: 1.0000 | Freq: Four times a day (QID) | Status: DC

## 2014-07-13 NOTE — Progress Notes (Signed)
[facility]t scheduled for [facility]27

## 2014-07-13 NOTE — Progress Notes (Signed)
Has burning on tongue that starts as sores, then peels off.  Exam shows raw areas on tongue that is sore to touch. Patient not able to afford ENT referral.

## 2014-07-13 NOTE — Progress Notes (Signed)
Nutrition note: GDM diet education Pt is a newly diagnosed GDM pt. Pt has gained 13.6# @ [id number], which is wnl. Pt reports eating 3 meals & 2 snacks/d. Pt is taking a PNV. Pt reports nausea and heartburn occ but no vomiting. Pt reports walking some days. Pt received verbal & written education on GDM diet via language line. Discussed tips to decrease nausea & heartburn. Discussed wt gain goals of 15-25# or 0.6#/wk. Pt agrees to follow GDM diet with 3 meals & 2-3 snacks/d with proper CHO/ protein combination. Pt has WIC & plans to BF. F/u in 2-4 wks Blondell RevealLaura Waymond Meador, MS, RD, LDN, [facility]

## 2014-07-13 NOTE — Progress Notes (Signed)
  Patient was seen on 07/13/14 for Gestational Diabetes self-management . The following learning objectives were met by the patient :   States the definition of Gestational Diabetes   States when to check blood glucose levels  Demonstrates proper blood glucose monitoring techniques  States the effect of stress and exercise on blood glucose levels  States the importance of limiting caffeine and abstaining from alcohol and smoking  Plan:  Consider  increasing your activity level by walking daily as tolerated Begin checking BG before breakfast and 2 hours after first bit of breakfast, lunch and dinner after  as directed by MD  Take medication  as directed by MD  Blood glucose monitor given:  Patient instructed to monitor glucose levels: FBS: 60 - <90 1 hour: <140 2 hour: <120  Patient received the following handouts:  Nutrition Diabetes and Pregnancy  Carbohydrate Counting List  Meal Planning worksheet  Patient will be seen for follow-up as needed.

## 2014-07-15 ENCOUNTER — Telehealth: Payer: Self-pay | Admitting: *Deleted

## 2014-07-15 ENCOUNTER — Encounter: Payer: Self-pay | Admitting: Obstetrics and Gynecology

## 2014-07-15 DIAGNOSIS — O24419 Gestational diabetes mellitus in pregnancy, unspecified control: Secondary | ICD-10-CM

## 2014-07-15 MED ORDER — GLUCOSE BLOOD VI STRP
ORAL_STRIP | Status: DC
Start: 2014-07-15 — End: 2014-09-13

## 2014-07-15 NOTE — Telephone Encounter (Signed)
[facility] test [facility].  Rx e-prescribed as requested.

## 2014-07-20 ENCOUNTER — Ambulatory Visit (INDEPENDENT_AMBULATORY_CARE_PROVIDER_SITE_OTHER): Payer: Medicaid Other | Admitting: Family Medicine

## 2014-07-20 VITALS — BP 96/54 | HR 90 | Wt 183.5 lb

## 2014-07-20 DIAGNOSIS — O24419 Gestational diabetes mellitus in pregnancy, unspecified control: Secondary | ICD-10-CM

## 2014-07-20 LAB — POCT URINALYSIS DIP (DEVICE)
BILIRUBIN URINE: NEGATIVE
GLUCOSE, UA: NEGATIVE mg/dL
Hgb urine dipstick: NEGATIVE
Ketones, ur: 40 mg/dL — AB
Leukocytes, UA: NEGATIVE
NITRITE: NEGATIVE
PH: 6 (ref 5.0–8.0)
Protein, ur: NEGATIVE mg/dL
SPECIFIC GRAVITY, URINE: 1.02 (ref 1.005–1.030)
Urobilinogen, UA: 0.2 mg/dL (ref 0.0–1.0)

## 2014-07-20 MED ORDER — GLYBURIDE 2.5 MG PO TABS: 2.5000 mg | ORAL_TABLET | Freq: Every day | ORAL | Status: DC

## 2014-07-20 NOTE — Progress Notes (Signed)
Fasting - 2/7 >90 2hr PP 9/21 > 120. Start Glyburide 2.5mg  in AM

## 2014-07-20 NOTE — Progress Notes (Signed)
Used lang line [id number]; patient reports swelling from ankle to thigh

## 2014-07-20 NOTE — Patient Instructions (Signed)
Gestational Diabetes Mellitus Gestational diabetes mellitus, often simply referred to as gestational diabetes, is a type of diabetes that some women develop during pregnancy. In gestational diabetes, the pancreas does not make enough insulin (a hormone), the cells are less responsive to the insulin that is made (insulin resistance), or both.Normally, insulin moves sugars from food into the tissue cells. The tissue cells use the sugars for energy. The lack of insulin or the lack of normal response to insulin causes excess sugars to build up in the blood instead of going into the tissue cells. As a result, high blood sugar (hyperglycemia) develops. The effect of high sugar (glucose) levels can cause many problems.  RISK FACTORS You have an increased chance of developing gestational diabetes if you have a family history of diabetes and also have one or more of the following risk factors:  A body mass index over 30 (obesity).  A previous pregnancy with gestational diabetes.  An older age at the time of pregnancy. If blood glucose levels are kept in the normal range during pregnancy, women can have a healthy pregnancy. If your blood glucose levels are not well controlled, there may be risks to you, your unborn baby (fetus), your labor and delivery, or your newborn baby.  SYMPTOMS  If symptoms are experienced, they are much like symptoms you would normally expect during pregnancy. The symptoms of gestational diabetes include:   Increased thirst (polydipsia).  Increased urination (polyuria).  Increased urination during the night (nocturia).  Weight loss. This weight loss may be rapid.  Frequent, recurring infections.  Tiredness (fatigue).  Weakness.  Vision changes, such as blurred vision.  Fruity smell to your breath.  Abdominal pain. DIAGNOSIS Diabetes is diagnosed when blood glucose levels are increased. Your blood glucose level may be checked by one or more of the following blood  tests:  A fasting blood glucose test. You will not be allowed to eat for at least 8 hours before a blood sample is taken.  A random blood glucose test. Your blood glucose is checked at any time of the day regardless of when you ate.  A hemoglobin A1c blood glucose test. A hemoglobin A1c test provides information about blood glucose control over the previous 3 months.  An oral glucose tolerance test (OGTT). Your blood glucose is measured after you have not eaten (fasted) for 1-3 hours and then after you drink a glucose-containing beverage. Since the hormones that cause insulin resistance are highest at about 24-28 weeks of a pregnancy, an OGTT is usually performed during that time. If you have risk factors for gestational diabetes, your health care provider may test you for gestational diabetes earlier than 24 weeks of pregnancy. TREATMENT   You will need to take diabetes medicine or insulin daily to keep blood glucose levels in the desired range.  You will need to match insulin dosing with exercise and healthy food choices. The treatment goal is to maintain the before-meal (preprandial), bedtime, and overnight blood glucose level at 60-99 mg/dL during pregnancy. The treatment goal is to further maintain peak after-meal blood sugar (postprandial glucose) level at 100-140 mg/dL. HOME CARE INSTRUCTIONS   Have your hemoglobin A1c level checked twice a year.  Perform daily blood glucose monitoring as directed by your health care provider. It is common to perform frequent blood glucose monitoring.  Monitor urine ketones when you are ill and as directed by your health care provider.  Take your diabetes medicine and insulin as directed by your health care provider   to maintain your blood glucose level in the desired range.  Never run out of diabetes medicine or insulin. It is needed every day.  Adjust insulin based on your intake of carbohydrates. Carbohydrates can raise blood glucose levels but  need to be included in your diet. Carbohydrates provide vitamins, minerals, and fiber which are an essential part of a healthy diet. Carbohydrates are found in fruits, vegetables, whole grains, dairy products, legumes, and foods containing added sugars.  Eat healthy foods. Alternate 3 meals with 3 snacks.  Maintain a healthy weight gain. The usual total expected weight gain varies according to your prepregnancy body mass index (BMI).  Carry a medical alert card or wear your medical alert jewelry.  Carry a 15-gram carbohydrate snack with you at all times to treat low blood glucose (hypoglycemia). Some examples of 15-gram carbohydrate snacks include:  Glucose tablets, 3 or 4.  Glucose gel, 15-gram tube.  Raisins, 2 tablespoons (24 g).  Jelly beans, 6.  Animal crackers, 8.  Fruit juice, regular soda, or low-fat milk, 4 ounces (120 mL).  Gummy treats, 9.  Recognize hypoglycemia. Hypoglycemia during pregnancy occurs with blood glucose levels of 60 mg/dL and below. The risk for hypoglycemia increases when fasting or skipping meals, during or after intense exercise, and during sleep. Hypoglycemia symptoms can include:  Tremors or shakes.  Decreased ability to concentrate.  Sweating.  Increased heart rate.  Headache.  Dry mouth.  Hunger.  Irritability.  Anxiety.  Restless sleep.  Altered speech or coordination.  Confusion.  Treat hypoglycemia promptly. If you are alert and able to safely swallow, follow the 15:15 rule:  Take 15-20 grams of rapid-acting glucose or carbohydrate. Rapid-acting options include glucose gel, glucose tablets, or 4 ounces (120 mL) of fruit juice, regular soda, or low-fat milk.  Check your blood glucose level 15 minutes after taking the glucose.  Take 15-20 grams more of glucose if the repeat blood glucose level is still 70 mg/dL or below.  Eat a meal or snack within 1 hour once blood glucose levels return to normal.  Be alert to polyuria  (excess urination) and polydipsia (excess thirst) which are early signs of hyperglycemia. An early awareness of hyperglycemia allows for prompt treatment. Treat hyperglycemia as directed by your health care provider.  Engage in at least 30 minutes of physical activity a day or as directed by your health care provider. Ten minutes of physical activity timed 30 minutes after each meal is encouraged to control postprandial blood glucose levels.  Adjust your insulin dosing and food intake as needed if you start a new exercise or sport.  Follow your sick-day plan at any time you are unable to eat or drink as usual.  Avoid tobacco and alcohol use.  Keep all follow-up visits as directed by your health care provider.  Follow the advice of your health care provider regarding your prenatal and post-delivery (postpartum) appointments, meal planning, exercise, medicines, vitamins, blood tests, other medical tests, and physical activities.  Perform daily skin and foot care. Examine your skin and feet daily for cuts, bruises, redness, nail problems, bleeding, blisters, or sores.  Brush your teeth and gums at least twice a day and floss at least once a day. Follow up with your dentist regularly.  Schedule an eye exam during the first trimester of your pregnancy or as directed by your health care provider.  Share your diabetes management plan with your workplace or school.  Stay up-to-date with immunizations.  Learn to manage stress.    Obtain ongoing diabetes education and support as needed.  Learn about and consider breastfeeding your baby.  You should have your blood sugar level checked 6-12 weeks after delivery. This is done with an oral glucose tolerance test (OGTT). SEEK MEDICAL CARE IF:   You are unable to eat food or drink fluids for more than 6 hours.  You have nausea and vomiting for more than 6 hours.  You have a blood glucose level of 200 mg/dL and you have ketones in your  urine.  There is a change in mental status.  You develop vision problems.  You have a persistent headache.  You have upper abdominal pain or discomfort.  You develop an additional serious illness.  You have diarrhea for more than 6 hours.  You have been sick or have had a fever for a couple of days and are not getting better. SEEK IMMEDIATE MEDICAL CARE IF:   You have difficulty breathing.  You no longer feel the baby moving.  You are bleeding or have discharge from your vagina.  You start having premature contractions or labor. MAKE SURE YOU:  Understand these instructions.  Will watch your condition.  Will get help right away if you are not doing well or get worse. Document Released: 01/01/2001 Document Revised: 02/09/2014 Document Reviewed: 04/23/2012 Exit[employer] Patient Information 2015 [employer]. This information is not intended to replace advice given to you by your health care provider. Make sure you discuss any questions you have with your health care provider.  

## 2014-08-03 ENCOUNTER — Ambulatory Visit (INDEPENDENT_AMBULATORY_CARE_PROVIDER_SITE_OTHER): Payer: Medicaid Other | Admitting: Obstetrics & Gynecology

## 2014-08-03 VITALS — BP 98/52 | HR 76 | Wt 183.1 lb

## 2014-08-03 DIAGNOSIS — O24419 Gestational diabetes mellitus in pregnancy, unspecified control: Secondary | ICD-10-CM

## 2014-08-03 LAB — POCT URINALYSIS DIP (DEVICE)
Bilirubin Urine: NEGATIVE
Glucose, UA: NEGATIVE mg/dL
Hgb urine dipstick: NEGATIVE
Ketones, ur: NEGATIVE mg/dL
LEUKOCYTES UA: NEGATIVE
Nitrite: NEGATIVE
Protein, ur: NEGATIVE mg/dL
Specific Gravity, Urine: 1.02 (ref 1.005–1.030)
Urobilinogen, UA: 0.2 mg/dL (ref 0.0–1.0)
pH: 6 (ref 5.0–8.0)

## 2014-08-03 NOTE — Progress Notes (Signed)
FBS <90 and PP <121 doing well current meds, start NST 2/week today

## 2014-08-03 NOTE — Progress Notes (Signed)
Used pacific interpreter [phone]#[id number]; patient was unable to make tomorrow's ENT appt, so she moved appt until 11/11

## 2014-08-03 NOTE — Patient Instructions (Signed)
Gestational Diabetes Mellitus Gestational diabetes mellitus, often simply referred to as gestational diabetes, is a type of diabetes that some women develop during pregnancy. In gestational diabetes, the pancreas does not make enough insulin (a hormone), the cells are less responsive to the insulin that is made (insulin resistance), or both.Normally, insulin moves sugars from food into the tissue cells. The tissue cells use the sugars for energy. The lack of insulin or the lack of normal response to insulin causes excess sugars to build up in the blood instead of going into the tissue cells. As a result, high blood sugar (hyperglycemia) develops. The effect of high sugar (glucose) levels can cause many problems.  RISK FACTORS You have an increased chance of developing gestational diabetes if you have a family history of diabetes and also have one or more of the following risk factors:  A body mass index over 30 (obesity).  A previous pregnancy with gestational diabetes.  An older age at the time of pregnancy. If blood glucose levels are kept in the normal range during pregnancy, women can have a healthy pregnancy. If your blood glucose levels are not well controlled, there may be risks to you, your unborn baby (fetus), your labor and delivery, or your newborn baby.  SYMPTOMS  If symptoms are experienced, they are much like symptoms you would normally expect during pregnancy. The symptoms of gestational diabetes include:   Increased thirst (polydipsia).  Increased urination (polyuria).  Increased urination during the night (nocturia).  Weight loss. This weight loss may be rapid.  Frequent, recurring infections.  Tiredness (fatigue).  Weakness.  Vision changes, such as blurred vision.  Fruity smell to your breath.  Abdominal pain. DIAGNOSIS Diabetes is diagnosed when blood glucose levels are increased. Your blood glucose level may be checked by one or more of the following blood  tests:  A fasting blood glucose test. You will not be allowed to eat for at least 8 hours before a blood sample is taken.  A random blood glucose test. Your blood glucose is checked at any time of the day regardless of when you ate.  A hemoglobin A1c blood glucose test. A hemoglobin A1c test provides information about blood glucose control over the previous 3 months.  An oral glucose tolerance test (OGTT). Your blood glucose is measured after you have not eaten (fasted) for 1-3 hours and then after you drink a glucose-containing beverage. Since the hormones that cause insulin resistance are highest at about 24-28 weeks of a pregnancy, an OGTT is usually performed during that time. If you have risk factors for gestational diabetes, your health care provider may test you for gestational diabetes earlier than 24 weeks of pregnancy. TREATMENT   You will need to take diabetes medicine or insulin daily to keep blood glucose levels in the desired range.  You will need to match insulin dosing with exercise and healthy food choices. The treatment goal is to maintain the before-meal (preprandial), bedtime, and overnight blood glucose level at 60-99 mg/dL during pregnancy. The treatment goal is to further maintain peak after-meal blood sugar (postprandial glucose) level at 100-140 mg/dL. HOME CARE INSTRUCTIONS   Have your hemoglobin A1c level checked twice a year.  Perform daily blood glucose monitoring as directed by your health care provider. It is common to perform frequent blood glucose monitoring.  Monitor urine ketones when you are ill and as directed by your health care provider.  Take your diabetes medicine and insulin as directed by your health care provider   to maintain your blood glucose level in the desired range.  Never run out of diabetes medicine or insulin. It is needed every day.  Adjust insulin based on your intake of carbohydrates. Carbohydrates can raise blood glucose levels but  need to be included in your diet. Carbohydrates provide vitamins, minerals, and fiber which are an essential part of a healthy diet. Carbohydrates are found in fruits, vegetables, whole grains, dairy products, legumes, and foods containing added sugars.  Eat healthy foods. Alternate 3 meals with 3 snacks.  Maintain a healthy weight gain. The usual total expected weight gain varies according to your prepregnancy body mass index (BMI).  Carry a medical alert card or wear your medical alert jewelry.  Carry a 15-gram carbohydrate snack with you at all times to treat low blood glucose (hypoglycemia). Some examples of 15-gram carbohydrate snacks include:  Glucose tablets, 3 or 4.  Glucose gel, 15-gram tube.  Raisins, 2 tablespoons (24 g).  Jelly beans, 6.  Animal crackers, 8.  Fruit juice, regular soda, or low-fat milk, 4 ounces (120 mL).  Gummy treats, 9.  Recognize hypoglycemia. Hypoglycemia during pregnancy occurs with blood glucose levels of 60 mg/dL and below. The risk for hypoglycemia increases when fasting or skipping meals, during or after intense exercise, and during sleep. Hypoglycemia symptoms can include:  Tremors or shakes.  Decreased ability to concentrate.  Sweating.  Increased heart rate.  Headache.  Dry mouth.  Hunger.  Irritability.  Anxiety.  Restless sleep.  Altered speech or coordination.  Confusion.  Treat hypoglycemia promptly. If you are alert and able to safely swallow, follow the 15:15 rule:  Take 15-20 grams of rapid-acting glucose or carbohydrate. Rapid-acting options include glucose gel, glucose tablets, or 4 ounces (120 mL) of fruit juice, regular soda, or low-fat milk.  Check your blood glucose level 15 minutes after taking the glucose.  Take 15-20 grams more of glucose if the repeat blood glucose level is still 70 mg/dL or below.  Eat a meal or snack within 1 hour once blood glucose levels return to normal.  Be alert to polyuria  (excess urination) and polydipsia (excess thirst) which are early signs of hyperglycemia. An early awareness of hyperglycemia allows for prompt treatment. Treat hyperglycemia as directed by your health care provider.  Engage in at least 30 minutes of physical activity a day or as directed by your health care provider. Ten minutes of physical activity timed 30 minutes after each meal is encouraged to control postprandial blood glucose levels.  Adjust your insulin dosing and food intake as needed if you start a new exercise or sport.  Follow your sick-day plan at any time you are unable to eat or drink as usual.  Avoid tobacco and alcohol use.  Keep all follow-up visits as directed by your health care provider.  Follow the advice of your health care provider regarding your prenatal and post-delivery (postpartum) appointments, meal planning, exercise, medicines, vitamins, blood tests, other medical tests, and physical activities.  Perform daily skin and foot care. Examine your skin and feet daily for cuts, bruises, redness, nail problems, bleeding, blisters, or sores.  Brush your teeth and gums at least twice a day and floss at least once a day. Follow up with your dentist regularly.  Schedule an eye exam during the first trimester of your pregnancy or as directed by your health care provider.  Share your diabetes management plan with your workplace or school.  Stay up-to-date with immunizations.  Learn to manage stress.    Obtain ongoing diabetes education and support as needed.  Learn about and consider breastfeeding your baby.  You should have your blood sugar level checked 6-12 weeks after delivery. This is done with an oral glucose tolerance test (OGTT). SEEK MEDICAL CARE IF:   You are unable to eat food or drink fluids for more than 6 hours.  You have nausea and vomiting for more than 6 hours.  You have a blood glucose level of 200 mg/dL and you have ketones in your  urine.  There is a change in mental status.  You develop vision problems.  You have a persistent headache.  You have upper abdominal pain or discomfort.  You develop an additional serious illness.  You have diarrhea for more than 6 hours.  You have been sick or have had a fever for a couple of days and are not getting better. SEEK IMMEDIATE MEDICAL CARE IF:   You have difficulty breathing.  You no longer feel the baby moving.  You are bleeding or have discharge from your vagina.  You start having premature contractions or labor. MAKE SURE YOU:  Understand these instructions.  Will watch your condition.  Will get help right away if you are not doing well or get worse. Document Released: 01/01/2001 Document Revised: 02/09/2014 Document Reviewed: 04/23/2012 Exit[employer] Patient Information 2015 [employer]. This information is not intended to replace advice given to you by your health care provider. Make sure you discuss any questions you have with your health care provider.  

## 2014-08-06 ENCOUNTER — Other Ambulatory Visit: Payer: Medicaid Other

## 2014-08-07 ENCOUNTER — Ambulatory Visit (INDEPENDENT_AMBULATORY_CARE_PROVIDER_SITE_OTHER): Payer: Medicaid Other | Admitting: *Deleted

## 2014-08-07 VITALS — BP 94/65 | HR 83

## 2014-08-07 DIAGNOSIS — O24419 Gestational diabetes mellitus in pregnancy, unspecified control: Secondary | ICD-10-CM

## 2014-08-07 LAB — US OB FOLLOW UP

## 2014-08-10 ENCOUNTER — Encounter (HOSPITAL_COMMUNITY): Payer: Self-pay | Admitting: Emergency Medicine

## 2014-08-10 ENCOUNTER — Ambulatory Visit (INDEPENDENT_AMBULATORY_CARE_PROVIDER_SITE_OTHER): Payer: Medicaid Other | Admitting: Family Medicine

## 2014-08-10 VITALS — BP 94/56 | HR 79 | Temp 98.0°F | Wt 185.8 lb

## 2014-08-10 DIAGNOSIS — O34219 Maternal care for unspecified type scar from previous cesarean delivery: Secondary | ICD-10-CM

## 2014-08-10 DIAGNOSIS — O3421 Maternal care for scar from previous cesarean delivery: Secondary | ICD-10-CM

## 2014-08-10 DIAGNOSIS — O24419 Gestational diabetes mellitus in pregnancy, unspecified control: Secondary | ICD-10-CM

## 2014-08-10 LAB — POCT URINALYSIS DIP (DEVICE)
GLUCOSE, UA: NEGATIVE mg/dL
Hgb urine dipstick: NEGATIVE
KETONES UR: 15 mg/dL — AB
Leukocytes, UA: NEGATIVE
Nitrite: NEGATIVE
Protein, ur: NEGATIVE mg/dL
Urobilinogen, UA: 1 mg/dL (ref 0.0–1.0)
pH: 5.5 (ref 5.0–8.0)

## 2014-08-10 LAB — GLUCOSE, CAPILLARY: GLUCOSE-CAPILLARY: 73 mg/dL (ref 70–99)

## 2014-08-10 NOTE — Patient Instructions (Addendum)
Ugonjwa wa kisukari wa ujauzito [address], [place] nyingi tu [place] wa kisukari wa [place], ni aina ya ugonjwa wa kisukari kwamba baadhi ya wanawake kuendeleza wakati wa ujauzito. Katika ujauzito kisukari, kongosho hafanyi insulini Allgood (homoni), seli ni kutojibu insulini kwamba ni yaliyotolewa (insulini upinzani), au wote wawili. Ailene Ards, insulini hatua sukari na vyakula katika seli tishu. Seli tishu kutumia sukari kwa ajili ya nishati. Ukosefu wa insulini au ukosefu wa mwitikio wa Vanetta Mulders kwa insulini husababisha sukari kupita kiasi ili [place]) damu badala ya Lenoria Chime seli tishu. Matokeo yake, sukari damu (hyperglycemia) yanaendelea. Cristal Ford za sukari (glucose) ngazi inaweza kusababisha matatizo mengi. HATARI FACTORS Margaretmary Lombard nafasi ya [place] wa kisukari wa ujauzito Belknap historia ya familia ya ugonjwa wa kisukari na pia kuwa moja au zaidi ya haya mambo ya hatari: Mwili molekuli index zaidi ya 30 (fetma). Mimba ya awali na ujauzito kisukari. Umri mkubwa wakati wa ujauzito. Kama viwango vya sukari damu ni agizo [place] mbalimbali ya kawaida wakati wa ujauzito, wanawake wanaweza kuwa na mimba na afya. Kama viwango vya damu glucose yako si vizuri kudhibitiwa, [place], mtoto wako tumboni (kitoto), [place] yako na kujifungua, au mtoto [place]. DALILI Kama dalili ni uzoefu, wao ni kiasi kama dalili wewe ingekuwa kawaida wanatarajia wakati wa ujauzito. Dalili za ujauzito kisukari ni pamoja na: Kuongezeka kwa kiu (polydipsia). Kuongezeka kwenda haja ndogo (polyuria). Kuongezeka kwenda haja ndogo wakati wa usiku (nocturia). Kupungua uzito. Hii kupoteza uzito inaweza [place]. Mara kwa [place], mara kwa [place]. Francella Solian (uchovu). Udhaifu. Mabadiliko Vision, Principal Financial. Harufu fruity kwa pumzi yako. Maumivu ya tumbo. Utambuzi Kisukari ni kukutwa wakati viwango vya sukari damu ni kuongezeka. Damu yako  glucose ngazi inaweza kuangaliwa kwa moja au zaidi ya haya vipimo vya damu: Damu kufunga glucose mtihani. Huwezi kuruhusiwa kula kwa angalau saa 8 kabla sampuli za damu ni kuchukuliwa. Damu bila mpangilio sukari mtihani. Damu Gwyneth Revels ni checked SCANA Corporation wa siku bila kujali wakati wakala. Damu A1c sukari damu mtihani. Mtihani damu A1c hutoa habari kuhusu sukari damu udhibiti wa miezi 3. Simulizi glucose kuvumiliana mtihani (OGTT). Damu yako sukari ni kipimo baada ya wewe si Cherene Julian) kwa masaa 1-3 na kisha baada ya kunywa sukari zenye kinywaji. Tangu homoni kwamba kusababisha upinzani insulini ni kubwa zaidi [place] kuhusu 24-28 wiki ya mimba, OGTT ni kawaida kufanywa wakati huo. Kama una hatari kwa ujauzito [place], mhudumu wako wa afya huenda mtihani kwa ugonjwa wa kisukari wa ujauzito wiki mapema kuliko 24 ya mimba. TREATMENT Unahitaji kuchukua kisukari dawa au insulini kila siku kuweka viwango vya sukari damu katika mbalimbali taka. Unahitaji mechi insulini dosing na zoezi na afya uchaguzi wa chakula. Lengo matibabu ni kudumisha kabla-mlo (preprandial), Joellyn Quails, na mara moja damu glucose ngazi katika 60-99 mg / DL wakati wa ujauzito. Lengo matibabu ni zaidi kudumisha kilele baada ya mlo damu sukari (glucose postprandial) ngazi 100-140 mg / DL. HOME CARE MAELEKEZO Na ukosefu wa damu A1c yako ngazi kuchunguzwa mara mbili kwa mwaka. Kufanya kila siku sukari damu ufuatiliaji kama ilivyoagizwa na mtoa huduma wako wa afya. Ni jambo la kawaida kufanya mara kwa mara sukari damu ufuatiliaji. Kufuatilia mkojo KeySpan unapokuwa mgonjwa na kama ilivyoagizwa na mtoa huduma wako wa afya. Henrene Pastor kisukari dawa yako na insulini kama ilivyoagizwa na mtoa huduma wako wa afya ili kudumisha damu yako glucose ngazi mbalimbali taka. Kamwe Milus Height nje ya ugonjwa wa kisukari dawa au insulini. Inahitajika kila siku. Kurekebisha insulini kulingana na ulaji wako wa wanga. Wanga inaweza kuongeza viwango  vya  sukari damu lakini haja ya kuwa pamoja katika mlo wako. Wanga kutoa vitamini, madini, na nyuzi ambayo ni sehemu muhimu ya chakula na afya. Wanga hupatikana [place], [place], nafaka nzima, bidhaa za [place], [place], na vyakula vyenye sukari aliongeza. Kula vyakula vya afya. Mbadala 3 milo na 3 vitafunio. Kudumisha afya uzito faida. Kawaida jumla inatarajiwa uzito inatofautiana [place] na prepregnancy yako mwili molekuli index (BMI). Romie Levee matibabu tahadhari kadi au kuvaa tahadhari yako ya matibabu [place]. Romie Levee 15-gram wanga vitafunio na wewe wakati wote kutibu sukari ya damu (hypoglycemia). Baadhi ya mifano ya 15-gram wanga vitafunio ni pamoja na: Vidonge vya sukari, 3 au 4. Sukari gel, 15-gram bomba. Zabibu, Vijiko 2 (24 g). Jelly maharage, 6. Crackers wanyama, 8. Maji ya [place], mara kwa mara soda, au maziwa chini mafuta, ounces 4 (120 mililita). Gummy chipsi, 9. Kutambua hypoglycemia. Hypoglycemia wakati wa ujauzito hutokea kwa viwango vya sukari damu ya 60 mg / DL na chini. Hatari kwa hypoglycemia huongezeka wakati kufunga au Peyton Bottoms Arie Sabina, wakati au baada ya zoezi [place], na wakati wa [place]. Dalili hypoglycemia ni: Mitikisiko au shakes. Ilipungua uwezo kwa makini. Jasho. Kuongezeka kwa kiwango cha moyo. Maumivu ya kichwa. Kavu kinywa. Njaa. Kuwashwa. Wasiwasi. Anahangaika usingizi. Ilibadilika hotuba au uratibu. Machafuko. Kutibu hypoglycemia mara moja. Kama wewe ni macho na Brennan Bailey wa usalama [place], kufuata kanuni 15:15: Henrene Pastor gramu 15-20 ya haraka-kaimu sukari au wanga. Chaguzi haraka-kaimu ni pamoja na sukari gel, vidonge vya sukari, au ounces 4 (120 mililita) ya maji ya [place], [place] kwa mara soda, au maziwa chini [place]. Angalia damu yako glucose ngazi ya dakika 15 baada ya Reunion sukari. Kuchukua gramu 15-20 zaidi ya sukari ikiwa damu kurudia sukari ngazi bado ni 70 mg / DL au chini. Kula mlo au vitafunio ndani ya saa 1 mara moja viwango vya sukari  damu Azerba[place]. Kuwa macho na polyuria (kukojoa kupita kiasi) na polydipsia (kiu ziada) ambayo ni dalili za mwanzo za hyperglycemia. Ufahamu mapema ya hyperglycemia inaruhusu kwa ajili ya matibabu ya haraka. Kutibu hyperglycemia kama ilivyoagizwa na mtoa huduma wako wa afya. Kushiriki [employer]ilent Technologies angalau dakika 30 ya mazoezi ya viungo siku au kama ilivyoagizwa na mtoa huduma wako wa afya. Dakika kumi ya shughuli za kimwili wakati muafaka dakika 30 baada ya kila mlo ni moyo wa kudhibiti postprandial viwango vya sukari damu. Annitta [place] insulini yako dosing na ulaji wa chakula kama inahitajika kama kuanza zoezi mpya au mchezo. Kufuata mpango wako mgonjwa siku wakati wowote Nordstrom kula au [place]. Jenelle Mages tumbaku na matumizi ya pombe. Nedra Hai yote ziara kufuatilia kama ilivyoagizwa na mtoa huduma wako wa afya. Kufuata ushauri wa mhudumu wako wa afya kuhusu kabla ya kujifungua yako na baada ya [place] (baada ya [place]) [employer], [place] wa mipango, zoezi, [place], vitamini, vipimo vya damu, vipimo vingine matibabu, na shughuli za kimwili. Kufanya ngozi kila siku na miguu huduma. Kuchunguza ngozi yako na miguu kila siku kwa ajili ya [place], [place], [place], [address], [place] na damu, Company secretary, au vidonda. Mswaki meno yako na ufizi angalau mara mbili kwa siku na floss angalau mara moja kwa siku. Kufuatilia na meno yako The Kroger. Ratiba ya jicho mtihani wakati wa miezi mitatu ya kwanza ya mimba yako au kama ilivyoagizwa na mtoa huduma wako wa afya. Kushiriki kisukari mpango wako usimamizi na mahali pa kazi yako au shule. Kukaa up-to-tarehe na chanjo. Kujifunza kusimamia dhiki. Kupata elimu inayoendelea kisukari na msaada kama inahitajika. Kujifunza kuhusu na [place] mtoto wako. August Saucer damu yako ngazi ya sukari checked wiki 6-12 baada ya kujifungua. Hii inafanywa kwa mdomo glucose kuvumiliana  mtihani (OGTT). [facility] IF: Huwezi kula maji  maji chakula au kinywaji kwa zaidi ya saa 6. Una Kichefuchefu na kutapika kwa zaidi ya saa 6. Una sukari damu ngazi ya 200 mg / DL na una ketoni katika mkojo wako. Kuna mabadiliko katika hali ya New Cityakili. Kuendeleza matatizo Cablevision Systemsya maono. Williemae AreaUna kichwa kuendelea. Una maumivu ya tumbo ya juu au usumbufu. Kuendeleza ziada ugonjwa mbaya. Unaharisha kwa zaidi ya saa 6. Umekuwa wagonjwa au kuwa na homa kwa siku kadhaa na si kupata bora. [facility] IF: Una ugumu wa kupumua. Wewe tena kuhisi mtoto AmerisourceBergen Corporationkusonga mbele. Wewe ni kutokwa na damu au kuwa na kutokwa kutoka uke Washington Court Housewako. Wewe kuanza kuwa na contractions mapema au Auburnkazi. Lelon HuhHAKIKISHA WEWERosemary Holms: Kuelewa maelekezo hayo. Itakuwa kuangalia hali yako. Kupata msaada mara moja kama wewe si kufanya vizuri au mbaya. Hati iliyotolewa: 2001/01/01 hati Revised: 2014/02/09 hati upya: 2012/04/23 ExitCare Mgonjwa Habari  2015 [employer]. Habari hii si nia ya kuchukua nafasi ushauri aliyopewa na wewe kwa mtoa huduma wako wa afya. Henriette CombsKuhakikisha kujadili maswali yoyote una na mtoa huduma wako wa afya.  Orson GearKunyonyesha Arlee MuslimKuamua Orson Gearkunyonyesha ni moja ya uchaguzi bora unaweza kufanya kwa ajili ya wewe na mtoto wako. Mabadiliko Agilent Technologieskatika homoni wakati wa ujauzito husababisha matiti yako tishu Harrisburgkukua na kuongezeka kwa idadi na ukubwa wa ducts yako maziwa. Homoni hizi pia kuruhusu protini, sukari, na mafuta South Africakutoka usambazaji wa damu Lake Tracichesteryako kufanya maziwa katika maziwa-huzalisha yako tezi. Homoni Mozambiquekuzuia maziwa kutoka kuwa iliyotolewa kabla mtoto kuzaliwa ikiwa ni pamoja na ya haraka maziwa mtiririko baada ya Fox River Grovekuzaliwa. Mara baada ya Sloveniakunyonya imeanza, ni mawazo ya mtoto wako, kama vile yake au sucking yake, wala kilio, inaweza kuchochea kutolewa kwa maziwa na maziwa-huzalisha Altamahawyako tezi. Faida za Hauppaugekunyonyesha Kwa Baby wako Maziwa yako kwanza Hopewell(kolostramu) husaidia mtoto wako kasoro ya mfumo kazi vizuri zaidi. DominicaKuna kinga katika maziwa yako kwamba kusaidia mtoto wako Papua New Guineakupigana na  maambukizi. Mtoto wako ana kupunguza matukio ya pumu, allergy, na ghafla mtoto kifo syndrome. Virutubisho Textron Inckatika maziwa ya mama ni bora kwa mtoto wako kuliko formula watoto Scientist, research (life sciences)wachanga na ni iliyoundwa kwa ajili ya mahitaji ya kipekee ya mtoto wako. Maziwa ya matiti inaboresha mtoto wako ubongo Mitchellmaendeleo. Mtoto wako ni chini ya uwezekano wa Oasiskuendeleza hali nyingine, kama vile AshkumKunenepa, pumu, au aina ya 2 kisukari. Nevin BloodgoodKwa ajili yako Kunyonyesha husaidia Saltillokujenga dhamana maalum sana kati ya wewe na mtoto wako. Kunyonyesha ni rahisi. Maziwa ya matiti ni daima inapatikana kwenye joto sahihi na gharama chochote. Unyonyeshaji husaidia kuchoma kalori na husaidia kupoteza uzito alipata wakati wa ujauzito. Orson GearKunyonyesha hufanya mfuko wa Ovidio Hangeruzazi mkataba wako kwa prepregnancy ukubwa wake kwa kasi na kupungua damu (lochia) baada ya kujifungua. Kunyonyesha husaidia kupunguza hatari ya kupatwa na aina 2 ugonjwa wa Minnehahakisukari, Zenaugonjwa wa Carpentermifupa, na kifua au kansa ya ovari baadaye Tarpey Villagekatika maisha. ISHARA KUWA BABY YAKO NI HUNGRY Dalili za mwanzo za Njaa Kuongezeka alertness au shughuli. Kukaza mwendo. Harakati ya kichwa kutoka upande wa pili. Harakati ya kichwa na kufumbua kinywa wakati kona ya kinywa au shavu ni stroked (mizizi). Kuongezeka kwa sucking sauti, smacking midomo, cooing, kuugua, au squeaking. Mkono kwa mdomo harakati. Kuongezeka kwa sucking za vidole au mikono. Dalili marehemu ya Sara Leejaa Fussing. Vipindi kilio. Uliokithiri Dalili za Njaa Dalili za njaa kali itahitaji Estoniakutuliza na kuwafariji kabla mtoto wako utakuwa na Brennan Baileyuwezo wa kunyonyesha kwa mafanikio. Je, si kusubiri kwa ishara zilizofuatana nalo la njaa uliokithiri Jerseykutokea kabla kuanzisha maziwa ya mama: BrayKutotulia. Norris CrossKubwa, imara kilio. [place]. Orson GearKunyonyesha BASICS Kunyonyesha Kufundwa Kupata nafasi vizuri Con-waykukaa au uongo chini, na shingo yako na  nyuma vizuri mkono. Weka mto au zitakunjwa blanketi chini ya mtoto wako [place] kwake kwa kiwango cha kifua yako  (kama wewe ni ameketi). Uuguzi mito ni maalum iliyoundwa na kusaidia silaha zenu na mtoto wako wakati wewe [place]. Henriette Combs Ralph Dowdy tumbo ya mtoto wako inakabiliwa na tumbo yako. Upole massage matiti yako. Na fingertips, massage kutoka ukuta yako kifua kuelekea nipple yako katika mzunguko wa [place]. Hii inahimiza CSX Corporation. Caleen Jobs ya Bulger hatua hii wakati wa Mold[place] ikiwa maziwa yako unapita polepole. Kusaidia matiti yako na 4 vidole chini na kidole gumba juu nipple yako. ZOXWRUEAVWU vidole ni vizuri mbali na nipple na mdomo wa mtoto wako. Kiharusi midomo ya mtoto wako upole kidole au nipple. [facility] kinywa cha mtoto wako ni wazi upana wa Vena Rua, haraka [place] mtoto wako kifua [place], [place] nipple yako yote na kama kiasi ya eneo mwekundu [place] chuchu (areola) iwezekanavyo ndani ya mdomo wa mtoto wako. Zaidi areola lazima kionekane juu ya mtoto wako juu ya mdomo kuliko chini ya mdomo wa chini. Ulimi mtoto wako lazima kati yake au gum yake chini na kifua yako. Henriette Combs kwamba kinywa mtoto wako ni usahihi nafasi nzuri [place] chuchu (latched). Midomo ya mtoto wako lazima Kuwait muhuri juu ya matiti yako na kuwa aligeuka (everted). Ni jambo la [place] kwa mtoto wako Romulus wa [place] 2-3 ili [place] mtiririko wa [employer]. Latching Kufundisha mtoto wako jinsi ya latch kwenye matiti yako vizuri ni muhimu sana. Latch yasiyofaa inaweza [id number] maumivu nipple na kupungua kwa ugavi wa maziwa kwa ajili yenu na maskini uzito [place] mtoto [place]. [place]rie Noe, Alexander Bergeron mtoto wako si latched kwenye chuchu vizuri, yeye Helene Shoe baadhi ya hewa Modesto Charon. Hii inaweza kufanya mtoto wako fussy. Burping mtoto wako wakati kubadili matiti wakati wa Mold[place] inaweza kusaidia kujikwamua hewa. Hata hivyo, [place] mtoto wako kwa latch juu vizuri bado ni njia bora ya Beltrami fussiness kutoka kumeza hewa Radene Knee. Dalili kwamba mtoto wako amefanikiwa latched kwenye  chuchu: Kimya tugging au sucking kimya, bila kusababisha wewe maumivu. Cornelia Copa Carollee Massed kati ya kila 3-4 sucks. Misuli harakati juu na mbele ya masikio yake wakati sucking. Dalili kwamba mtoto wako hana mafanikio latched Lockheed Martin chuchu: Sucking sauti au smacking sauti kutoka mtoto wako [place]. Maumivu nipple. Kama unafikiri mtoto wako hana latched [place] usahihi, yatakuwa [place] kona ya kinywa ya mtoto wako kuvunja suction na Karie Schwalbe kati ya ufizi mtoto wako. Kujaribu kunyonya uanzishwaji tena. Dalili za Mafanikio Kunyonyesha Elroy Channel mtoto wako: Kupungua taratibu Liston Alba idadi ya sucks au kukoma kamili ya sucking. Kuanguka wamelala. Utulivu wa mwili wake. Uhifadhi wa kiasi kidogo cha maziwa Delphi. Kuruhusu go ya matiti yako kwa Walt Disney. Gilford Rile Estanislado Emms: Matiti kuwa yameongezeka [place], [place], na [place] wa masaa 1-3 baada Duncan Dull. Matiti [place] ni laini mara baada Malawi. Kuongezeka kwa maziwa [place], [place] ni pamoja na mabadiliko [place] msimamo na rangi na siku ya tano ya maziwa ya mama. Chuchu ambayo si [place], [place], au kutokwa na damu. Seleta Rhymes Mtoto wako Kupata kutosha Maziwa Wetting angalau 3 diapers katika kipindi cha saa 24. Mkojo lazima iwe wazi na rangi ya njano na umri wa siku 5. Angalau 3 viti katika kipindi cha saa 24 na umri wa siku 5. Kinyesi lazima laini na njano. Angalau 3 viti katika kipindi cha saa 24 na umri wa siku 7. Kinyesi lazima seedy na njano. Hakuna hasara ya uzito mkubwa kuliko 10% ya uzito wa Sloven[place] siku ya kwanza ya 3 ya umri. Wastani uzito wa ounces  4-7 (113-198 g) kwa wiki baada ya umri wa siku 4. Thabiti siku uzito na umri wa siku 5, bila kupoteza uzito baada ya umri wa wiki 2. Baada ya chakula, mtoto wako anaweza mate juu kiasi kidogo. Hii ni kawaida. Kunyonyesha FREQUENCY NA MUDA Earvin Hansen kwa mara itasaidia [place] maziwa zaidi na wanaweza Moz[place] chuchu  kidonda na kifua engorgement. Kunyonyesha wakati kuhisi haja ya Loney Loh wa matiti yako au wakati mtoto wako anaonyesha ishara za njaa. Hii ni "Sloven[place] juu ya mahitaji." Kuepuka kuanzisha pacifier kwa mtoto wako wakati wewe ni Theresia Majors ya [place] maziwa ya mama Como wiki 4-6 baada ya mtoto [place]). Baada ya muda huu unaweza kuchagua kutumia pacifier. Utafiti Danise Mina kuwa matumizi pacifier Clovia Cuff wa [place] wa maisha ya mtoto hupunguza hatari ya ghafla mtoto kifo syndrome (SIDS). Kuruhusu mtoto wako hula kila kifua kwa muda mrefu kama yeye au yeye anataka. Kunyonyesha hadi mtoto wako ni WellPoint. [facility] mtoto wako unlatches au huanguka usingizi wakati Mold[place] kutoka kifua [place], kutoa kifua pili. Kwa sababu watoto Engineer, production nyingi usingizi [place] wiki chache za kwanza za [place], Chad haja ya [place] mtoto wako [place]. Orson Gear mara zitatofautiana mtoto kwa mtoto. Hata hivyo, kanuni zifuatazo inaweza Lonell Face mwongozo wa ZOXWRUEAVW Henriette Combs kwamba mtoto wako ni vizuri Willodean Rosenthal: Watoto wanaozaliwa (watoto wiki 4 ya umri au mdogo) Trevor Iha Orson Gear kila baada ya masaa 1-3. Watoto wanaozaliwa haipaswi kwenda masaa kwa muda mrefu kuliko 3 wakati wa mchana au saa 5 usiku bila maziwa ya mama. Derenda Mis [place] mtoto wako chini ya mara 8 [place] kipindi cha saa 24 hadi [place] vyakula imara kwa mtoto wako karibu miezi 6 ya umri. Matiti MILK [place] na [place] maziwa ya mama utapata Henriette Combs kwamba mtoto wako ni peke IXL, hata wakati ambapo Ontario. Hii ni muhimu hasa kama wewe ni kwenda nyuma kazi wakati bado ni kunyonyesha au wakati hawawezi kuwa sasa wakati wa feedings. Lactation yako mshauri anaweza ToysRus mwongozo juu ya jinsi ya muda mrefu ni salama kuhifadhi maziwa ya mama. Pampu ya matiti ni mashine kwamba utapata pampu maziwa kutoka matiti yako ndani ya [place]. Pumped maziwa yanaweza  kuhifadhiwa katika friji au freezer. Baadhi pampu kifua ni kuendeshwa kwa mkono, wakati wengine Baxter International. Libyan Arab Jamahiriya lactation mshauri wako ni aina kazi bora kwa ajili yenu. Pampu ya matiti Lavonia Drafts, lakini baadhi ya hospitali na vikundi vya Taiwan kukodisha pampu kifua Janetta Hora. Mshauri lactation inaweza kufundisha jinsi mkono Sebastian, [place] si kutumia pampu. KUSHUGHULIKIA KWA matiti yako INGAWA YOU [place] Chuchu inaweza [place], [place], na Bahamas. Mapendekezo yafuatayo inaweza kusaidia kutunza matiti yako moisturized na afya: Kuepuka kutumia sabuni juu ya chuchu zako. Kuvaa bra mkono. Ing[place] si required, maalum uuguzi bras na tank tops ni iliyoundwa na kuruhusu upatikanaji wa matiti yako kwa kunyonyesha bila kuchukua off bra yako yote au juu. Jenelle Mages amevaa underwire-style bras au bras tight sana. Hewa kavu chuchu yako kwa dakika 3-4 baada ya kila chakula. Kutumia tu usafi pamba bra kunyonya kuvuja maziwa. Kuvuja ya maziwa ya mama kati ya feedings ni jambo la [place]. Matumizi lanolin juu ya chuchu yako baada Malawi. Lanolin husaidia kudumisha ngozi yako ya Hornbeak. Kama matumizi ya lanolin safi, huna haja ya kusafisha mbali kabla ya Mold[place] mtoto wako tena. Safi lanolin si sumu kwa mtoto wako. Unaweza pia mkono [employer] [place] matone Man[employer] Inc ya maziwa ya mama na upole massage maziwa kwamba katika chuchu yako na kuruhusu maziwa kwa hewa kavu. [place] wiki chache za kwanza baada ya [place],  baadhi ya wanawake uzoefu matiti kubwa mno kamili (engorgement). Engorgement wanaweza Moroccokufanya matiti yako Palestinian Territorykujisikia mzito, joto, na zabuni United States Minor Outlying Islandsya kugusa. Peaks engorgement ndani ya siku 3-5 baada ya kujifungua. Mapendekezo yafuatayo inaweza kusaidia kupunguza engorgement: Kabisa tupu matiti yako wakati wa Mexicokunyonyesha au Melbournekusukuma. Unaweza kutaka kuanza kwa kutumia joto, joto unyevu (katika kuoga au kwa taulo joto maji-kulowekwa mkono) muda mfupi  kabla ya kulisha au Fruitland Parkkusukuma. Hii kuongezeka kwa mzunguko na husaidia mtiririko wa Agricultural consultantmaziwa. Kama mtoto wako hana kabisa tupu matiti yako wakati wa Cadizkunyonyesha, pampu maziwa yoyote ya ziada baada ya yeye au yeye ni kumaliza. Kuvaa bra snug (uuguzi au mara kwa White Swanmara) au juu tank kwa siku 1-2 United Arab Emirateskuashiria mwili wako kidogo kupunguza Drexel Heightsuzalishaji wa Sylvamaziwa. Kuomba vipande vya barafu kwa matiti yako, isipokuwa hii ni wasiwasi sana kwa ajili yenu. Henriette CombsKuhakikisha kwamba mtoto wako ni latched kwenye nafasi nzuri na vizuri Surinamewakati wa kunyonyesha. Kama engorgement likiendelea baada ya masaa 48 ya kufuatia mapendekezo Pleasant Grovehaya, wasiliana na mtoa huduma wako wa afya au lactation mshauri. UJUMLA AFYA MAPENDEKEZO wakati wa kunyonyesha Kula vyakula vya afya. Mbadala kati ya chakula na vitafunio, kula 3 ya kila kwa siku. Kwa sababu nini kula huathiri Swea Citymaziwa yako, Missouribaadhi ya vyakula inaweza Universitykufanya mtoto wako zaidi hasira Indian Springs Villagekuliko kawaida. Jenelle MagesKuepuka kula vyakula hivi kama wewe ni Dene Gentrykuhakikisha kwamba wao ni vibaya kuathiri mtoto wako. Otho NajjarKunywa maziwa, maji ya Rigbymatunda, na maji ili kukidhi kiu yako (kuhusu 10 miwani siku). Kupumzika mara nyingi, Naurukupumzika, na Grenadakuendelea kuchukua vitamini yako kabla ya kujifungua ili Mozambiquekuzuia uchovu, Foscoemsongo, na upungufu wa damu. Kuendelea matiti hundi Indiakujitambua. [place]uep[place] kutafuna na uvutaji tumbaku. Jenelle MagesKuepuka pombe na matumizi ya Freeportmadawa. Baadhi ya madawa ambayo inaweza kuwa na madhara kwa mtoto wako anaweza kupita maziwa ya mama. Ni muhimu [place]kuuliza mtoa huduma wako wa afya kabla ya kuchukua dawa yoyote, ikiwa ni pamoja na yote juu-ya Australiakukabiliana na dawa dawa kama vile vitamini na virutubisho mitishamba. Inawezekana kupata mimba wakati wa kunyonyesha. Delanna NoticeKama uzazi wa mpango ni taka, Libyan Arab Jamahiriyakuuliza mtoa huduma wako wa afya kuhusu chaguzi kwamba itakuwa salama kwa mtoto wako. [facility] IF: Gershon CraneKujisikia Carmin Richmondkama unataka Prudy Feelerkuacha kunyonyesha au wamekuwa kuchanganyikiwa kutokana na maziwa ya mama. Una matiti Kohl'schungu au  chuchu. Chuchu yako ni kupasuka au kutokwa na damu. Matiti yako ni nyekundu, zabuni, au joto. Una eneo kuvimba juu ya matiti aidha. Una homa au baridi. Una kichefuchefu au [place] Islandskutapika. Una mifereji ya maji zaidi ya maziwa ya mama kutoka chuchu zako. Matiti yako hawana kuwa kamili kabla feedings na siku ya tano baada ya kujifungua. Wewe kujisikia huzuni na huzuni. [place] wako ni pia usingizi kula vizuri. [place] wako kuwa na matatizo ya Strasburgkulala. [place] wako wetting chini ya 3 diapers katika kipindi cha saa 24. [place] wako ana chini ya 3 viti katika kipindi cha saa 24. Ngozi ya Electronic Data Systemsmtoto wako au sehemu nyeupe ya macho yake inakuwa njano. [place] wako si kupata uzito na siku 5 ya umri. [facility]: [place] wako ni overly uchovu (lethargic) na hataki kuamka na Oda Kiltskulisha. [place] wako yanaendelea homa unexplained. Hati iliyotolewa: 09/25/2005 hati Revised: 2013/09/30 hati upya: 2013/03/19 [employer]wa Habari  2015 [employer] [place] hii si nia ya kuchukua nafasi ushauri aliyopewa na wewe kwa mtoa huduma wako wa afya. Henriette CombsKuhakikisha kujadili maswali yoyote una na mtoa huduma wako wa afya.

## 2014-08-10 NOTE — Progress Notes (Signed)
NST reviewed and reactive. No book--reports BS 72-78 Denies hypoglycemia [facility] Ltd[facility]ific[facility] interpreter used # [id number]

## 2014-08-10 NOTE — Progress Notes (Signed)
[facility] visit [facility] ID [phone]#[id number]

## 2014-08-12 NOTE — Progress Notes (Signed)
08/07/2014 NST reviewed and reactive.  Avereigh Spainhower L. Harraway-Smith, M.D., FACOG    

## 2014-08-14 ENCOUNTER — Ambulatory Visit (INDEPENDENT_AMBULATORY_CARE_PROVIDER_SITE_OTHER): Payer: Medicaid Other | Admitting: *Deleted

## 2014-08-14 VITALS — BP 101/59 | HR 81

## 2014-08-14 DIAGNOSIS — O24419 Gestational diabetes mellitus in pregnancy, unspecified control: Secondary | ICD-10-CM

## 2014-08-14 LAB — US OB FOLLOW UP

## 2014-08-14 NOTE — Progress Notes (Signed)
NST performed today was reviewed and was found to be reactive.  AFI normal at 10.9 cm.  Continue recommended antenatal testing and prenatal care.

## 2014-08-17 ENCOUNTER — Ambulatory Visit (INDEPENDENT_AMBULATORY_CARE_PROVIDER_SITE_OTHER): Payer: Medicaid Other | Admitting: Obstetrics and Gynecology

## 2014-08-17 VITALS — BP 96/51 | HR 83 | Temp 97.7°F | Wt 185.4 lb

## 2014-08-17 DIAGNOSIS — O24419 Gestational diabetes mellitus in pregnancy, unspecified control: Secondary | ICD-10-CM

## 2014-08-17 LAB — POCT URINALYSIS DIP (DEVICE)
Bilirubin Urine: NEGATIVE
GLUCOSE, UA: NEGATIVE mg/dL
Hgb urine dipstick: NEGATIVE
Ketones, ur: NEGATIVE mg/dL
Leukocytes, UA: NEGATIVE
NITRITE: NEGATIVE
PH: 6.5 (ref 5.0–8.0)
Protein, ur: NEGATIVE mg/dL
Specific Gravity, Urine: 1.015 (ref 1.005–1.030)
Urobilinogen, UA: 0.2 mg/dL (ref 0.0–1.0)

## 2014-08-17 NOTE — Progress Notes (Signed)
No concerns or complaints. + FM, no VB, no LOF. No contractions.  #) A1GDM - FBS 82. 91. 81 - 2 hr pp - 81, 125, 92, 73, 71, 88, 145, 90, 102 - Well controlled, only 3 above goal.  NST performed today was reviewed and was found to be reactive.   #) PNC  - RTC on Thursday (11/12) for 36 wks labs and cultures + NST

## 2014-08-20 ENCOUNTER — Ambulatory Visit (INDEPENDENT_AMBULATORY_CARE_PROVIDER_SITE_OTHER): Payer: Medicaid Other | Admitting: *Deleted

## 2014-08-20 VITALS — BP 99/59 | HR 90 | Wt 184.8 lb

## 2014-08-20 DIAGNOSIS — O24419 Gestational diabetes mellitus in pregnancy, unspecified control: Secondary | ICD-10-CM

## 2014-08-20 LAB — US OB FOLLOW UP

## 2014-08-20 NOTE — Progress Notes (Signed)
NST reviewed and reactive.  Penny Barber, M.D., FACOG    

## 2014-08-20 NOTE — Progress Notes (Signed)
NST/AFI/[employer] Interpreter # [phone]

## 2014-08-24 ENCOUNTER — Ambulatory Visit (INDEPENDENT_AMBULATORY_CARE_PROVIDER_SITE_OTHER): Payer: Medicaid Other | Admitting: Family Medicine

## 2014-08-24 VITALS — BP 111/57 | HR 84 | Temp 97.8°F | Wt 187.3 lb

## 2014-08-24 DIAGNOSIS — O24419 Gestational diabetes mellitus in pregnancy, unspecified control: Secondary | ICD-10-CM

## 2014-08-24 DIAGNOSIS — O3421 Maternal care for scar from previous cesarean delivery: Secondary | ICD-10-CM

## 2014-08-24 DIAGNOSIS — O34219 Maternal care for unspecified type scar from previous cesarean delivery: Secondary | ICD-10-CM

## 2014-08-24 LAB — POCT URINALYSIS DIP (DEVICE)
Bilirubin Urine: NEGATIVE
GLUCOSE, UA: NEGATIVE mg/dL
Hgb urine dipstick: NEGATIVE
Ketones, ur: NEGATIVE mg/dL
Leukocytes, UA: NEGATIVE
Nitrite: NEGATIVE
Protein, ur: NEGATIVE mg/dL
SPECIFIC GRAVITY, URINE: 1.02 (ref 1.005–1.030)
Urobilinogen, UA: 0.2 mg/dL (ref 0.0–1.0)
pH: 6 (ref 5.0–8.0)

## 2014-08-24 NOTE — Patient Instructions (Signed)
Third Trimester of Pregnancy The third trimester is from week 29 through week 42, months 7 through 9. This trimester is when your unborn baby (fetus) is growing very fast. At the end of the ninth month, the unborn baby is about 20 inches in length. It weighs about 6-10 pounds.  HOME CARE   Avoid all smoking, herbs, and alcohol. Avoid drugs not approved by your doctor.  Only take medicine as told by your doctor. Some medicines are safe and some are not during pregnancy.  Exercise only as told by your doctor. Stop exercising if you start having cramps.  Eat regular, healthy meals.  Wear a good support bra if your breasts are tender.  Do not use hot tubs, steam rooms, or saunas.  Wear your seat belt when driving.  Avoid raw meat, uncooked cheese, and liter boxes and soil used by cats.  Take your prenatal vitamins.  Try taking medicine that helps you poop (stool softener) as needed, and if your doctor approves. Eat more fiber by eating fresh fruit, vegetables, and whole grains. Drink enough fluids to keep your pee (urine) clear or pale yellow.  Take warm water baths (sitz baths) to soothe pain or discomfort caused by hemorrhoids. Use hemorrhoid cream if your doctor approves.  If you have puffy, bulging veins (varicose veins), wear support hose. Raise (elevate) your feet for 15 minutes, 3-4 times a day. Limit salt in your diet.  Avoid heavy lifting, wear low heels, and sit up straight.  Rest with your legs raised if you have leg cramps or low back pain.  Visit your dentist if you have not gone during your pregnancy. Use a soft toothbrush to brush your teeth. Be gentle when you floss.  You can have sex (intercourse) unless your doctor tells you not to.  Do not travel far distances unless you must. Only do so with your doctor's approval.  Take prenatal classes.  Practice driving to the hospital.  Pack your hospital bag.  Prepare the baby's room.  Go to your doctor visits. GET  HELP IF:  You are not sure if you are in labor or if your water has broken.  You are dizzy.  You have mild cramps or pressure in your lower belly (abdominal).  You have a nagging pain in your belly area.  You continue to feel sick to your stomach (nauseous), throw up (vomit), or have watery poop (diarrhea).  You have bad smelling fluid coming from your vagina.  You have pain with peeing (urination). GET HELP RIGHT AWAY IF:   You have a fever.  You are leaking fluid from your vagina.  You are spotting or bleeding from your vagina.  You have severe belly cramping or pain.  You lose or gain weight rapidly.  You have trouble catching your breath and have chest pain.  You notice sudden or extreme puffiness (swelling) of your face, hands, ankles, feet, or legs.  You have not felt the baby move in over an hour.  You have severe headaches that do not go away with medicine.  You have vision changes. Document Released: 12/20/2009 Document Revised: 01/20/2013 Document Reviewed: 11/26/2012 ExitCare Patient Information 2015 [employer]. This information is not intended to replace advice given to you by your health care provider. Make sure you discuss any questions you have with your health care provider.  

## 2014-08-24 NOTE — Progress Notes (Signed)
Category 1 tracing with baseline in 140s.  Moderate variability, multiple accelerations, no decelerations. Fasting - 4/14 above 90 (92-97) 2hr PP - 4 above 120.   Continue taking glyburide in AM.  F/u in 1 week.

## 2014-08-27 ENCOUNTER — Ambulatory Visit (INDEPENDENT_AMBULATORY_CARE_PROVIDER_SITE_OTHER): Payer: Medicaid Other | Admitting: *Deleted

## 2014-08-27 ENCOUNTER — Other Ambulatory Visit: Payer: Medicaid Other

## 2014-08-27 VITALS — BP 102/66 | HR 88 | Wt 184.8 lb

## 2014-08-27 DIAGNOSIS — O322XX1 Maternal care for transverse and oblique lie, fetus 1: Secondary | ICD-10-CM

## 2014-08-27 DIAGNOSIS — O24419 Gestational diabetes mellitus in pregnancy, unspecified control: Secondary | ICD-10-CM

## 2014-08-27 LAB — POCT URINALYSIS DIP (DEVICE)
Glucose, UA: NEGATIVE mg/dL
Hgb urine dipstick: NEGATIVE
Ketones, ur: 15 mg/dL — AB
Leukocytes, UA: NEGATIVE
NITRITE: NEGATIVE
PROTEIN: NEGATIVE mg/dL
Specific Gravity, Urine: 1.025 (ref 1.005–1.030)
Urobilinogen, UA: 0.2 mg/dL (ref 0.0–1.0)
pH: 5.5 (ref 5.0–8.0)

## 2014-08-27 LAB — US OB FOLLOW UP

## 2014-08-27 NOTE — Progress Notes (Signed)
Used [employer] [phone]#[id number].  Re-evaluate fetal position on 11/23 due to transverse position today.

## 2014-08-27 NOTE — Progress Notes (Signed)
NST performed today was reviewed and was found to be reactive.  AFI normal at 9.5 cm. Follow up malpresentation on 08/31/14 scan, if still present, may offer ECV vs RCS. Continue recommended antenatal testing and prenatal care.

## 2014-08-31 ENCOUNTER — Ambulatory Visit (INDEPENDENT_AMBULATORY_CARE_PROVIDER_SITE_OTHER): Payer: Medicaid Other | Admitting: Obstetrics & Gynecology

## 2014-08-31 ENCOUNTER — Other Ambulatory Visit: Payer: Self-pay | Admitting: Obstetrics & Gynecology

## 2014-08-31 VITALS — BP 109/60 | HR 87 | Temp 97.3°F | Wt 185.4 lb

## 2014-08-31 DIAGNOSIS — O3421 Maternal care for scar from previous cesarean delivery: Secondary | ICD-10-CM

## 2014-08-31 DIAGNOSIS — O24419 Gestational diabetes mellitus in pregnancy, unspecified control: Secondary | ICD-10-CM

## 2014-08-31 DIAGNOSIS — O34219 Maternal care for unspecified type scar from previous cesarean delivery: Secondary | ICD-10-CM

## 2014-08-31 LAB — POCT URINALYSIS DIP (DEVICE)
Bilirubin Urine: NEGATIVE
Glucose, UA: 100 mg/dL — AB
Hgb urine dipstick: NEGATIVE
KETONES UR: NEGATIVE mg/dL
LEUKOCYTES UA: NEGATIVE
Nitrite: NEGATIVE
PH: 5.5 (ref 5.0–8.0)
Protein, ur: NEGATIVE mg/dL
SPECIFIC GRAVITY, URINE: 1.02 (ref 1.005–1.030)
Urobilinogen, UA: 0.2 mg/dL (ref 0.0–1.0)

## 2014-08-31 LAB — OB RESULTS CONSOLE GBS: STREP GROUP B AG: NEGATIVE

## 2014-08-31 MED ORDER — GLYBURIDE 2.5 MG PO TABS: ORAL_TABLET | ORAL | Status: DC

## 2014-08-31 NOTE — Progress Notes (Signed)
OBF/NST  [facility] [phone]#[id number] used.

## 2014-08-31 NOTE — Progress Notes (Signed)
[facility] NST performed today was reviewed and was found to be reactive.  Continue recommended antenatal testing and prenatal care. On review of blood sugars fastings 81-131, PP are mostly within range (1 abnormal 191 on 11/18).  Changed Glyburide to 2.5 mg po qam, 1.25 mg po qhs.  Reviewed fetal malpresentation; reviewed options of ECV vs RCS.  Risks reviewed.  Patient desires TOLAC. Will await ultrasound report for 09/04/14 and make decision; also will await 38 week EFW.  Pelvic cultures done today.  No other complaints or concerns.  Labor and fetal movement precautions reviewed.

## 2014-08-31 NOTE — Patient Instructions (Signed)
Return to clinic for any obstetric concerns or go to [facility] for evaluation  

## 2014-08-31 NOTE — Progress Notes (Signed)
BPP scheduled on 11/27

## 2014-09-01 LAB — GC/CHLAMYDIA PROBE AMP
CT PROBE, AMP APTIMA: NEGATIVE
GC Probe RNA: NEGATIVE

## 2014-09-02 LAB — CULTURE, BETA STREP (GROUP B ONLY)

## 2014-09-04 ENCOUNTER — Ambulatory Visit (HOSPITAL_COMMUNITY)
Admission: RE | Admit: 2014-09-04 | Discharge: 2014-09-04 | Disposition: A | Discharge status: Home / Self Care | Payer: Medicaid Other | Source: Ambulatory Visit | Attending: Family Medicine | Admitting: Family Medicine

## 2014-09-04 DIAGNOSIS — Z8759 Personal history of other complications of pregnancy, childbirth and the puerperium: Secondary | ICD-10-CM | POA: Diagnosis not present

## 2014-09-04 DIAGNOSIS — O3421 Maternal care for scar from previous cesarean delivery: Secondary | ICD-10-CM | POA: Diagnosis not present

## 2014-09-04 DIAGNOSIS — O24419 Gestational diabetes mellitus in pregnancy, unspecified control: Secondary | ICD-10-CM | POA: Diagnosis not present

## 2014-09-04 DIAGNOSIS — Z3A37 37 weeks gestation of pregnancy: Secondary | ICD-10-CM | POA: Diagnosis not present

## 2014-09-07 ENCOUNTER — Ambulatory Visit (INDEPENDENT_AMBULATORY_CARE_PROVIDER_SITE_OTHER): Payer: Medicaid Other | Admitting: Family Medicine

## 2014-09-07 VITALS — BP 100/63 | HR 85 | Temp 97.5°F | Wt 185.0 lb

## 2014-09-07 DIAGNOSIS — O3421 Maternal care for scar from previous cesarean delivery: Secondary | ICD-10-CM

## 2014-09-07 DIAGNOSIS — O34219 Maternal care for unspecified type scar from previous cesarean delivery: Secondary | ICD-10-CM

## 2014-09-07 DIAGNOSIS — O24419 Gestational diabetes mellitus in pregnancy, unspecified control: Secondary | ICD-10-CM

## 2014-09-07 LAB — POCT URINALYSIS DIP (DEVICE)
BILIRUBIN URINE: NEGATIVE
GLUCOSE, UA: NEGATIVE mg/dL
Hgb urine dipstick: NEGATIVE
Ketones, ur: NEGATIVE mg/dL
Leukocytes, UA: NEGATIVE
Nitrite: NEGATIVE
PROTEIN: NEGATIVE mg/dL
Specific Gravity, Urine: 1.015 (ref 1.005–1.030)
Urobilinogen, UA: 0.2 mg/dL (ref 0.0–1.0)
pH: 7 (ref 5.0–8.0)

## 2014-09-07 NOTE — Progress Notes (Signed)
Used pac[employer] interpreter [phone]#[id number]

## 2014-09-07 NOTE — Patient Instructions (Signed)
External Cephalic Version External cephalic version is turning a baby that is presenting his or her buttocks first (breech) or is lying sideways in the uterus (transverse) to a head-first position. This makes the labor and delivery faster, safer for the mother and baby, and lessens the chance for a cesarean section. It should not be tried until the pregnancy is [redacted] weeks along or longer. BEFORE THE PROCEDURE   Do not take aspirin.  Do not eat for 4 hours before the procedure.  Tell your caregiver if you have a cold, fever, or an infection.  Tell your caregiver if you are having contractions.  Tell your caregiver if you are leaking or had a gush of fluid from your vagina.  Tell your caregiver if you have any vaginal bleeding or abnormal discharge.  If you are being admitted the same day, arrive at the hospital at least one hour before the procedure to sign any necessary documents and to get prepared for the procedure.  Tell your caregiver if you had any problems with anesthetics in the past.  Tell your caregiver if you are taking any medications that your caregiver does not know about. This includes over-the-counter and prescription drugs, herbs, eye drops and creams. PROCEDURE  First, an ultrasound is done to make sure the baby is breech or transverse.  A non-stress test or biophysical profile is done on the baby before the ECV. This is done to make sure it is safe for the baby to have the ECV. It may also be done after the procedure to make sure the baby is okay.  ECV is done in the delivery/surgical room with an anesthesiologist present. There should be a setup for an emergency cesarean section with a full nursing and nursery staff available and ready.  The patient may be given a medication to relax the uterine muscles. An epidural may be given for any discomfort. It is helpful for the success of the ECV.  An electronic fetal monitor is placed on the uterus during the procedure to  make sure the baby is okay.  If the mother is Rh-negative, Rho (D) immune globulin will be given to her to prevent Rh problems for future pregnancies.  The mother is followed closely for 2 to 3 hours after the procedure to make sure no problems develop. BENEFITS OF ECV  Easier and safer labor and delivery for the mother and baby.  Lower incidence of cesarean section.  Lower costs with a vaginal delivery. RISKS OF ECV  The placenta pulls away from the wall of the uterus before delivery (abruption of the placenta).  Rupture of the uterus, especially in patients with a previous cesarean section.  Fetal distress.  Early (premature) labor.  Premature rupture of the membranes.  The baby will return to the breech or transverse lie position.  Death of the fetus can happen but is very rare. ECV SHOULD BE STOPPED IF:  The fetal heart tones drop.  The mother is having a lot of pain.  You cannot turn the baby after several attempts. ECV SHOULD NOT BE DONE IF:  The non-stress test or biophysical profile is abnormal.  There is vaginal bleeding.  An abnormal shaped uterus is present.  There is heart disease or uncontrolled high blood pressure in the mother.  There are twins or more.  The placenta covers the opening of the cervix (placenta previa).  You had a previous cesarean section with a classical incision or major surgery of the uterus.  There   is not enough amniotic fluid in the sac (oligohydramnios).  The baby is too small for the pregnancy or has not developed normally (anomaly).  Your membranes have ruptured. HOME CARE INSTRUCTIONS   Have someone take you home after the procedure.  Rest at home for several hours.  Have someone stay with you for a few hours after you get home.  After ECV, continue with your prenatal visits as directed.  Continue your regular diet, rest and activities.  Do not do any strenuous activities for a couple of days. SEEK IMMEDIATE  MEDICAL CARE IF:   You develop vaginal bleeding.  You have fluid coming out of your vagina (bag of water may have broken).  You develop uterine contractions.  You do not feel the baby move or there is less movement of the baby.  You develop abdominal pain.  You develop an oral temperature of 102 F (38.9 C) or higher. Document Released: 03/20/2007 Document Revised: 02/09/2014 Document Reviewed: 01/13/2009 ExitCare Patient Information 2015 [employer]. This information is not intended to replace advice given to you by your health care provider. Make sure you discuss any questions you have with your health care provider.  

## 2014-09-07 NOTE — Progress Notes (Signed)
BPP on 11/27 - 8/8, transverse lie

## 2014-09-07 NOTE — Progress Notes (Signed)
Interpreter [phone] Category 1 tracing with baseline in 140s.  Moderate variability, multiple accelerations, no decelerations. CBGs all controlled.  Continue glyburide. ECV scheduled.

## 2014-09-08 ENCOUNTER — Telehealth (HOSPITAL_COMMUNITY): Payer: Self-pay | Admitting: *Deleted

## 2014-09-08 NOTE — Telephone Encounter (Signed)
Preadmission screen Interpreter number [phone]

## 2014-09-09 ENCOUNTER — Inpatient Hospital Stay (HOSPITAL_COMMUNITY): Admission: RE | Admit: 2014-09-09 | Payer: Medicaid Other | Source: Ambulatory Visit

## 2014-09-09 ENCOUNTER — Observation Stay (HOSPITAL_COMMUNITY)
Admission: AD | Admit: 2014-09-09 | Discharge: 2014-09-09 | Disposition: A | Discharge status: Home / Self Care | Payer: Medicaid Other | Source: Ambulatory Visit | Attending: Obstetrics & Gynecology | Admitting: Obstetrics & Gynecology

## 2014-09-09 DIAGNOSIS — O09899 Supervision of other high risk pregnancies, unspecified trimester: Secondary | ICD-10-CM

## 2014-09-09 DIAGNOSIS — F419 Anxiety disorder, unspecified: Secondary | ICD-10-CM | POA: Diagnosis not present

## 2014-09-09 DIAGNOSIS — O9989 Other specified diseases and conditions complicating pregnancy, childbirth and the puerperium: Secondary | ICD-10-CM

## 2014-09-09 DIAGNOSIS — O321XX0 Maternal care for breech presentation, not applicable or unspecified: Secondary | ICD-10-CM | POA: Diagnosis present

## 2014-09-09 DIAGNOSIS — O24419 Gestational diabetes mellitus in pregnancy, unspecified control: Secondary | ICD-10-CM

## 2014-09-09 DIAGNOSIS — O322XX1 Maternal care for transverse and oblique lie, fetus 1: Secondary | ICD-10-CM

## 2014-09-09 DIAGNOSIS — I8390 Asymptomatic varicose veins of unspecified lower extremity: Secondary | ICD-10-CM | POA: Insufficient documentation

## 2014-09-09 DIAGNOSIS — Z79899 Other long term (current) drug therapy: Secondary | ICD-10-CM | POA: Diagnosis not present

## 2014-09-09 DIAGNOSIS — Z3A38 38 weeks gestation of pregnancy: Secondary | ICD-10-CM | POA: Insufficient documentation

## 2014-09-09 DIAGNOSIS — Z283 Underimmunization status: Secondary | ICD-10-CM

## 2014-09-09 LAB — CBC
HCT: 37.4 % (ref 36.0–46.0)
HEMOGLOBIN: 12.7 g/dL (ref 12.0–15.0)
MCH: 30.5 pg (ref 26.0–34.0)
MCHC: 34 g/dL (ref 30.0–36.0)
MCV: 89.7 fL (ref 78.0–100.0)
Platelets: 190 10*3/uL (ref 150–400)
RBC: 4.17 MIL/uL (ref 3.87–5.11)
RDW: 12.8 % (ref 11.5–15.5)
WBC: 4.9 10*3/uL (ref 4.0–10.5)

## 2014-09-09 LAB — ABO/RH: ABO/RH(D): A POS

## 2014-09-09 LAB — GLUCOSE, CAPILLARY
GLUCOSE-CAPILLARY: 53 mg/dL — AB (ref 70–99)
GLUCOSE-CAPILLARY: 91 mg/dL (ref 70–99)

## 2014-09-09 MED ORDER — TERBUTALINE SULFATE 1 MG/ML IJ SOLN
0.2500 mg | Freq: Once | INTRAMUSCULAR | Status: AC
  Administered 2014-09-09: 0.25 mg via SUBCUTANEOUS
  Filled 2014-09-09: qty 1

## 2014-09-09 MED ORDER — LACTATED RINGERS IV SOLN
INTRAVENOUS | Status: DC
  Administered 2014-09-09: 09:00:00 via INTRAVENOUS

## 2014-09-09 NOTE — Progress Notes (Signed)
Patient ID: Penny Barber, female   DOB: 04/14/1986, 28 y.o.   MRN: [id number] After informed verbal consent, Terbutaline 0.25 mg SQ given, ECV was attempted under Ultrasound guidance. FHR was Cat 1 with some uterine irritability.   FHR was reactive before and after the procedure.   Pt. Tolerated the procedure well. But, the ECV was NOT successful.  The procedure was aborted and the pt will be scheduled for repeat c-section at 39 weeks.  Chanda Laperle L. Harraway-Smith, M.D., [facility]ACOG

## 2014-09-09 NOTE — Discharge Instructions (Signed)
Cesarean Delivery  Cesarean delivery is the birth of a baby through a cut (incision) in the abdomen and womb (uterus).  LET YOUR HEALTH CARE PROVIDER KNOW ABOUT:  All medicines you are taking, including vitamins, herbs, eye drops, creams, and over-the-counter medicines.  Previous problems you or members of your family have had with the use of anesthetics.  Any blood disorders you have.  Previous surgeries you have had.  Medical conditions you have.  Any allergies you have.  Complicationsinvolving the pregnancy. RISKS AND COMPLICATIONS  Generally, this is a safe procedure. However, as with any procedure, complications can occur. Possible complications include:  Bleeding.  Infection.  Blood clots.  Injury to surrounding organs.  Problems with anesthesia.  Injury to the baby. BEFORE THE PROCEDURE   You may be given an antacid medicine to drink. This will prevent acid contents in your stomach from going into your lungs if you vomit during the surgery.  You may be given an antibiotic medicine to prevent infection. PROCEDURE   Hair may be removed from your pubic area and your lower abdomen. This is to prevent infection in the incision site.  A tube (Foley catheter) will be placed in your bladder to drain your urine from your bladder into a bag. This keeps your bladder empty during surgery.  An IV tube will be placed in your vein.  You may be given medicine to numb the lower half of your body (regional anesthetic). If you were in labor, you may have already had an epidural in place which can be used in both labor and cesarean delivery. You may possibly be given medicine to make you sleep (general anesthetic) though this is not as common.  An incision will be made in your abdomen that extends to your uterus. There are 2 basic kinds of incisions:  The horizontal (transverse) incision. Horizontal incisions are from side to side and are used for most routine cesarean  deliveries.  The vertical incision. The vertical incision is from the top of the abdomen to the bottom and is less commonly used. It is often done for women who have a serious complication (extreme prematurity) or under emergency situations.  The horizontal and vertical incisions may both be used at the same time. However, this is very uncommon.  An incision is then made in your uterus to deliver the baby.  Your baby will then be delivered.  Both incisions are then closed with absorbable stitches. AFTER THE PROCEDURE   If you were awake during the surgery, you will see your baby right away. If you were asleep, you will see your baby as soon as you are awake.  You may breastfeed your baby after surgery.  You may be able to get up and walk the same day as the surgery. If you need to stay in bed for a period of time, you will receive help to turn, cough, and take deep breaths after surgery. This helps prevent lung problems such as pneumonia.  Do not get out of bed alone the first time after surgery. You will need help getting out of bed until you are able to do this by yourself.  You may be able to shower the day after your cesarean delivery. After the bandage (dressing) is taken off the incision site, a nurse will assist you to shower if you would like help.  You will have pneumatic compression hose placed on your lower legs. This is done to prevent blood clots. When you are up   and walking regularly, they will no longer be necessary.  Do not cross your legs when you sit.  Save any blood clots that you pass. If you pass a clot while on the toilet, do not flush it. Call for the nurse. Tell the nurse if you think you are bleeding too much or passing too many clots.  You will be given medicine as needed. Let your health care providers know if you are hurting. You may also be given an antibiotic to prevent an infection.  Your IV tube will be taken out when you are drinking a reasonable  amount of fluids. The Foley catheter is taken out when you are up and walking.  If your blood type is Rh negative and your baby's blood type is Rh positive, you will be given a shot of anti-D immune globulin. This shot prevents you from having Rh problems with a future pregnancy. You should get the shot even if you had your tubes tied (tubal ligation).  If you are allowed to take the baby for a walk, place the baby in the bassinet and push it. Do not carry your baby in your arms. Document Released: 09/25/2005 Document Revised: 07/16/2013 Document Reviewed: 04/16/2013 ExitCare Patient Information 2015 [employer]. This information is not intended to replace advice given to you by your health care provider. Make sure you discuss any questions you have with your health care provider.  

## 2014-09-09 NOTE — H&P (Signed)
LABOR ADMISSION HISTORY AND PHYSICAL  Penny Barber is a 28 y.o. female [phone] with IUP at 3921w0d here for external cephalic version.  Sono:   10481w1d 53% EFW   Prenatal History/Complications:  Past Medical History: Past Medical History  Diagnosis Date  . Medical history non-contributory   . Varicose veins   . Anxiety     when heart skips a beat    Past Surgical History: Past Surgical History  Procedure Laterality Date  . Cesarean section      Obstetrical History: OB History    Gravida Para Term Preterm AB TAB SAB Ectopic Multiple Living   3 2 1 1      2       Social History: History   Social History  . Marital Status: Married    Spouse Name: N/A    Number of Children: N/A  . Years of Education: N/A   Social History Main Topics  . Smoking status: Never Smoker   . Smokeless tobacco: Never Used  . Alcohol Use: No  . Drug Use: No  . Sexual Activity: Yes    Birth Control/ Protection: None   Other Topics Concern  . Not on file   Social History Narrative   ** Merged History Encounter **        Family History: Family History  Problem Relation Age of Onset  . Alcohol abuse Neg Hx   . Arthritis Neg Hx   . Asthma Neg Hx   . Birth defects Neg Hx   . Cancer Neg Hx   . COPD Neg Hx   . Depression Neg Hx   . Diabetes Neg Hx   . Drug abuse Neg Hx   . Early death Neg Hx   . Hearing loss Neg Hx   . Heart disease Neg Hx   . Hyperlipidemia Neg Hx   . Hypertension Neg Hx   . Kidney disease Neg Hx   . Learning disabilities Neg Hx   . Mental illness Neg Hx   . Mental retardation Neg Hx   . Miscarriages / Stillbirths Neg Hx   . Stroke Neg Hx   . Vision loss Neg Hx   . Varicose Veins Neg Hx     Allergies: No Known Allergies  Prescriptions prior to admission  Medication Sig Dispense Refill Last Dose  . glyBURIDE (DIABETA) 2.5 MG tablet 1 tablet with breakfast and 0.5 tablet at bedtime 30 tablet 1 09/08/2014 at Unknown time  . Prenatal Vit-Fe Fumarate-FA  (PRENATAL MULTIVITAMIN) TABS tablet Take 1 tablet by mouth daily at 12 noon. 30 tablet 3 09/08/2014 at Unknown time  . ACCU-CHEK FASTCLIX LANCETS MISC Inject 1 each into the skin 4 (four) times daily. [phone] for testing 4 times daily 102 each 12 Taking  . glucose blood test strip Use as instructed to test blood sugar 4 times daily. 50 each 12 Taking     Review of Systems   All systems reviewed and negative except as stated in HPI  Blood pressure 105/64, pulse 80, temperature 98.2 F (36.8 C), temperature source Oral, resp. rate 18, height 5' 3.75" (1.619 m), weight 185 lb (83.915 kg), last menstrual period 11/10/2013. General appearance: alert and cooperative Lungs: clear to auscultation bilaterally Heart: regular rate and rhythm Abdomen: soft, non-tender; bowel sounds normal Extremities: Homans sign is negative, no sign of DVT Presentation: breech Fetal monitoringBaseline: 135 bpm, Variability: Good {> 6 bpm), Accelerations: Reactive and Decelerations: Absent Uterine activityNone     Prenatal labs: ABO, Rh:  A/POS/-- (07/22 1329) Antibody: NEG (07/22 1329) Rubella:   RPR: NON REAC (09/18 1153)  HBsAg: NEGATIVE (07/22 1329)  HIV: NONREACTIVE (09/18 1153)  GBS: Negative (11/23 0000)  1 hr Glucola 123, 157 3h gtt 92/196/173/130 Genetic screening  Not done Anatomy US normal   [facility]  Dating   Genetic Screen 1 Screen:                 AFP:                    Quad:                  NIPS:  Anatomic US  Normal but incomplete  >> needs F/U  GTT Early:    123           Third trimester:   TDaP vaccine 06/26/14  Flu vaccine 06/26/14  GBS Negative  Contraception   Baby Food  Breast  Circumcision  NA  Pediatrician   Support Person      Results for orders placed or performed during the hospital encounter of 09/09/14 (from the past 24 hour(s))  CBC   Collection Time: 09/09/14  9:05 AM  Result Value Ref Range   WBC 4.9 4.0 - 10.5 K/uL   RBC 4.17 3.87 - 5.11 MIL/uL    Hemoglobin 12.7 12.0 - 15.0 g/dL   HCT 04.537.4 40.936.0 - 81.146.0 %   MCV 89.7 78.0 - 100.0 fL   MCH 30.5 26.0 - 34.0 pg   MCHC 34.0 30.0 - 36.0 g/dL   RDW 91.412.8 78.211.5 - 95.615.5 %   Platelets 190 150 - 400 K/uL    Patient Active Problem List   Diagnosis Date Noted  . [redacted] weeks gestation of pregnancy   . Transverse fetal lie, antepartum 08/27/2014  . Gestational diabetes mellitus, antepartum 07/03/2014  . History of cesarean delivery, antepartum 04/30/2014  . Rubella non-immune status, antepartum 04/30/2014  . Language barrier, cultural differences 04/30/2014  . IFG (impaired fasting glucose) 01/27/2014  . Back pain 09/24/2013  . Abdominal pain, left lower quadrant 09/24/2013    Assessment: Penny Barber is a 28 y.o. G3P1102 at 5672w0d with A2DM on glyburide with previous cesarean section 2/2 fetal distress (with initial vaginal delivery) here for external cephalic version  After informed consent with [employer] interpreter, including reviewing the potential risks of pain, failure to turn fetus,  rupture of membranes, uterine abruption, bleeding and fetal death.  Patient opted to proceed with the procedure. Terbutaline 0.25 mg SQ was given, ECV was attempted under Ultrasound guidance.     The version was unsuccessful.  FHR was 135 and reactive before and after the procedure.   Tolerated the procedure well.  There were no complications and the pt will be discharged to home after 1 -2 hours of fetal and uterine monitoring.  Pt will follow up for routine prenatal care within 1 week.   Penny Barber 09/09/2014, 10:14 AM

## 2014-09-10 ENCOUNTER — Ambulatory Visit (HOSPITAL_COMMUNITY)
Admission: RE | Admit: 2014-09-10 | Discharge: 2014-09-10 | Disposition: A | Discharge status: Home / Self Care | Payer: Medicaid Other | Source: Ambulatory Visit | Attending: Family Medicine | Admitting: Family Medicine

## 2014-09-10 ENCOUNTER — Other Ambulatory Visit: Payer: Self-pay | Admitting: Family Medicine

## 2014-09-10 ENCOUNTER — Inpatient Hospital Stay (HOSPITAL_COMMUNITY): Payer: Medicaid Other | Admitting: Anesthesiology

## 2014-09-10 ENCOUNTER — Inpatient Hospital Stay (HOSPITAL_COMMUNITY)
Admission: RE | Admit: 2014-09-10 | Discharge: 2014-09-13 | DRG: 765 | Disposition: A | Discharge status: Home / Self Care | Payer: Medicaid Other | Source: Ambulatory Visit | Attending: Obstetrics & Gynecology | Admitting: Obstetrics & Gynecology

## 2014-09-10 ENCOUNTER — Encounter (HOSPITAL_COMMUNITY): Payer: Self-pay | Admitting: *Deleted

## 2014-09-10 ENCOUNTER — Ambulatory Visit (INDEPENDENT_AMBULATORY_CARE_PROVIDER_SITE_OTHER): Payer: Medicaid Other | Admitting: Obstetrics & Gynecology

## 2014-09-10 ENCOUNTER — Encounter (HOSPITAL_COMMUNITY)
Admission: RE | Disposition: A | Discharge status: Home / Self Care | Payer: Self-pay | Source: Ambulatory Visit | Attending: Obstetrics & Gynecology

## 2014-09-10 VITALS — BP 107/54 | HR 79 | Temp 97.4°F | Wt 184.6 lb

## 2014-09-10 DIAGNOSIS — O4103X0 Oligohydramnios, third trimester, not applicable or unspecified: Secondary | ICD-10-CM | POA: Diagnosis present

## 2014-09-10 DIAGNOSIS — O24419 Gestational diabetes mellitus in pregnancy, unspecified control: Secondary | ICD-10-CM

## 2014-09-10 DIAGNOSIS — O3421 Maternal care for scar from previous cesarean delivery: Secondary | ICD-10-CM | POA: Diagnosis present

## 2014-09-10 DIAGNOSIS — K219 Gastro-esophageal reflux disease without esophagitis: Secondary | ICD-10-CM | POA: Diagnosis present

## 2014-09-10 DIAGNOSIS — O321XX0 Maternal care for breech presentation, not applicable or unspecified: Secondary | ICD-10-CM | POA: Diagnosis present

## 2014-09-10 DIAGNOSIS — Z603 Acculturation difficulty: Secondary | ICD-10-CM

## 2014-09-10 DIAGNOSIS — Z3A38 38 weeks gestation of pregnancy: Secondary | ICD-10-CM | POA: Diagnosis present

## 2014-09-10 DIAGNOSIS — O9989 Other specified diseases and conditions complicating pregnancy, childbirth and the puerperium: Secondary | ICD-10-CM

## 2014-09-10 DIAGNOSIS — O4103X1 Oligohydramnios, third trimester, fetus 1: Secondary | ICD-10-CM

## 2014-09-10 DIAGNOSIS — O24420 Gestational diabetes mellitus in childbirth, diet controlled: Secondary | ICD-10-CM | POA: Diagnosis present

## 2014-09-10 DIAGNOSIS — Z283 Underimmunization status: Secondary | ICD-10-CM

## 2014-09-10 DIAGNOSIS — Z2839 Other underimmunization status: Secondary | ICD-10-CM

## 2014-09-10 DIAGNOSIS — O99613 Diseases of the digestive system complicating pregnancy, third trimester: Secondary | ICD-10-CM | POA: Diagnosis present

## 2014-09-10 DIAGNOSIS — O4100X0 Oligohydramnios, unspecified trimester, not applicable or unspecified: Secondary | ICD-10-CM | POA: Diagnosis present

## 2014-09-10 DIAGNOSIS — O321XX1 Maternal care for breech presentation, fetus 1: Secondary | ICD-10-CM

## 2014-09-10 HISTORY — DX: Type 2 diabetes mellitus without complications: E11.9

## 2014-09-10 LAB — CBC
HCT: 41.9 % (ref 36.0–46.0)
Hemoglobin: 13.8 g/dL (ref 12.0–15.0)
MCH: 30 pg (ref 26.0–34.0)
MCHC: 32.9 g/dL (ref 30.0–36.0)
MCV: 91.1 fL (ref 78.0–100.0)
PLATELETS: 208 10*3/uL (ref 150–400)
RBC: 4.6 MIL/uL (ref 3.87–5.11)
RDW: 12.8 % (ref 11.5–15.5)
WBC: 6.9 10*3/uL (ref 4.0–10.5)

## 2014-09-10 LAB — POCT URINALYSIS DIP (DEVICE)
Bilirubin Urine: NEGATIVE
Glucose, UA: 100 mg/dL — AB
Hgb urine dipstick: NEGATIVE
KETONES UR: NEGATIVE mg/dL
Leukocytes, UA: NEGATIVE
Nitrite: NEGATIVE
PH: 5.5 (ref 5.0–8.0)
Protein, ur: NEGATIVE mg/dL
SPECIFIC GRAVITY, URINE: 1.02 (ref 1.005–1.030)
UROBILINOGEN UA: 0.2 mg/dL (ref 0.0–1.0)

## 2014-09-10 LAB — TYPE AND SCREEN
ABO/RH(D): A POS
ABO/RH(D): A POS
ANTIBODY SCREEN: NEGATIVE
Antibody Screen: NEGATIVE

## 2014-09-10 LAB — RPR

## 2014-09-10 LAB — GLUCOSE, CAPILLARY
GLUCOSE-CAPILLARY: 54 mg/dL — AB (ref 70–99)
GLUCOSE-CAPILLARY: 173 mg/dL — AB (ref 70–99)

## 2014-09-10 SURGERY — Surgical Case
Anesthesia: Spinal

## 2014-09-10 MED ORDER — PHENYLEPHRINE HCL 10 MG/ML IJ SOLN
INTRAMUSCULAR | Status: DC | PRN
  Administered 2014-09-10 (×2): 100 ug via INTRAVENOUS
  Administered 2014-09-10: 40 ug via INTRAVENOUS

## 2014-09-10 MED ORDER — CITRIC ACID-SODIUM CITRATE 334-500 MG/5ML PO SOLN
30.0000 mL | Freq: Once | ORAL | Status: AC
  Administered 2014-09-10: 30 mL via ORAL
  Filled 2014-09-10: qty 15

## 2014-09-10 MED ORDER — MORPHINE SULFATE 0.5 MG/ML IJ SOLN
INTRAMUSCULAR | Status: AC
  Filled 2014-09-10: qty 10

## 2014-09-10 MED ORDER — SIMETHICONE 80 MG PO CHEW
80.0000 mg | CHEWABLE_TABLET | ORAL | Status: DC
  Administered 2014-09-10 – 2014-09-13 (×3): 80 mg via ORAL
  Filled 2014-09-10 (×3): qty 1

## 2014-09-10 MED ORDER — SIMETHICONE 80 MG PO CHEW: 80.0000 mg | CHEWABLE_TABLET | ORAL | Status: DC | PRN

## 2014-09-10 MED ORDER — LACTATED RINGERS IV SOLN
INTRAVENOUS | Status: DC
  Administered 2014-09-11: 900 mL via INTRAVENOUS

## 2014-09-10 MED ORDER — BUPIVACAINE IN DEXTROSE 0.75-8.25 % IT SOLN
INTRATHECAL | Status: DC | PRN
  Administered 2014-09-10: 11 mg via INTRATHECAL

## 2014-09-10 MED ORDER — LANOLIN HYDROUS EX OINT: 1.0000 "application " | TOPICAL_OINTMENT | CUTANEOUS | Status: DC | PRN

## 2014-09-10 MED ORDER — LACTATED RINGERS IV SOLN
INTRAVENOUS | Status: DC
  Administered 2014-09-10 (×2): via INTRAVENOUS

## 2014-09-10 MED ORDER — OXYTOCIN 10 UNIT/ML IJ SOLN
40.0000 [IU] | INTRAVENOUS | Status: DC | PRN
  Administered 2014-09-10: 40 [IU] via INTRAVENOUS

## 2014-09-10 MED ORDER — WITCH HAZEL-GLYCERIN EX PADS: 1.0000 "application " | MEDICATED_PAD | CUTANEOUS | Status: DC | PRN

## 2014-09-10 MED ORDER — FENTANYL CITRATE 0.05 MG/ML IJ SOLN
INTRAMUSCULAR | Status: AC
  Filled 2014-09-10: qty 2

## 2014-09-10 MED ORDER — SIMETHICONE 80 MG PO CHEW
80.0000 mg | CHEWABLE_TABLET | Freq: Three times a day (TID) | ORAL | Status: DC
  Administered 2014-09-11 – 2014-09-13 (×6): 80 mg via ORAL
  Filled 2014-09-10 (×6): qty 1

## 2014-09-10 MED ORDER — ONDANSETRON HCL 4 MG/2ML IJ SOLN
INTRAMUSCULAR | Status: DC | PRN
  Administered 2014-09-10: 4 mg via INTRAVENOUS

## 2014-09-10 MED ORDER — MORPHINE SULFATE (PF) 0.5 MG/ML IJ SOLN
INTRAMUSCULAR | Status: DC | PRN
  Administered 2014-09-10: .15 mg via INTRATHECAL

## 2014-09-10 MED ORDER — OXYCODONE-ACETAMINOPHEN 5-325 MG PO TABS: 2.0000 | ORAL_TABLET | ORAL | Status: DC | PRN

## 2014-09-10 MED ORDER — BUPIVACAINE HCL (PF) 0.5 % IJ SOLN
INTRAMUSCULAR | Status: DC | PRN
  Administered 2014-09-10: 20 mL

## 2014-09-10 MED ORDER — SODIUM CHLORIDE 0.9 % IR SOLN
Status: DC | PRN
  Administered 2014-09-10: 1000 mL

## 2014-09-10 MED ORDER — FENTANYL CITRATE 0.05 MG/ML IJ SOLN: 25.0000 ug | INTRAMUSCULAR | Status: DC | PRN

## 2014-09-10 MED ORDER — CEFAZOLIN SODIUM-DEXTROSE 2-3 GM-% IV SOLR
2.0000 g | INTRAVENOUS | Status: AC
  Administered 2014-09-10: 2 g via INTRAVENOUS

## 2014-09-10 MED ORDER — CEFAZOLIN SODIUM-DEXTROSE 2-3 GM-% IV SOLR
INTRAVENOUS | Status: AC
  Filled 2014-09-10: qty 50

## 2014-09-10 MED ORDER — BUPIVACAINE HCL (PF) 0.5 % IJ SOLN
INTRAMUSCULAR | Status: AC
  Filled 2014-09-10: qty 30

## 2014-09-10 MED ORDER — TETANUS-DIPHTH-ACELL PERTUSSIS 5-2.5-18.5 LF-MCG/0.5 IM SUSP: 0.5000 mL | Freq: Once | INTRAMUSCULAR | Status: DC

## 2014-09-10 MED ORDER — FENTANYL CITRATE 0.05 MG/ML IJ SOLN
INTRAMUSCULAR | Status: DC | PRN
  Administered 2014-09-10: 25 ug via INTRATHECAL

## 2014-09-10 MED ORDER — SENNOSIDES-DOCUSATE SODIUM 8.6-50 MG PO TABS
2.0000 | ORAL_TABLET | ORAL | Status: DC
  Administered 2014-09-10 – 2014-09-13 (×3): 2 via ORAL
  Filled 2014-09-10 (×3): qty 2

## 2014-09-10 MED ORDER — LIDOCAINE HCL (PF) 1 % IJ SOLN
INTRAMUSCULAR | Status: AC
  Filled 2014-09-10: qty 5

## 2014-09-10 MED ORDER — PHENYLEPHRINE 40 MCG/ML (10ML) SYRINGE FOR IV PUSH (FOR BLOOD PRESSURE SUPPORT)
PREFILLED_SYRINGE | INTRAVENOUS | Status: AC
  Filled 2014-09-10: qty 5

## 2014-09-10 MED ORDER — ONDANSETRON HCL 4 MG/2ML IJ SOLN
INTRAMUSCULAR | Status: AC
  Filled 2014-09-10: qty 2

## 2014-09-10 MED ORDER — PHENYLEPHRINE 8 MG IN D5W 100 ML (0.08MG/ML) PREMIX OPTIME
INJECTION | INTRAVENOUS | Status: DC | PRN
  Administered 2014-09-10: 60 ug/min via INTRAVENOUS

## 2014-09-10 MED ORDER — SCOPOLAMINE 1 MG/3DAYS TD PT72
1.0000 | MEDICATED_PATCH | Freq: Once | TRANSDERMAL | Status: DC
  Administered 2014-09-10: 1.5 mg via TRANSDERMAL
  Filled 2014-09-10: qty 1

## 2014-09-10 MED ORDER — MENTHOL 3 MG MT LOZG: 1.0000 | LOZENGE | OROMUCOSAL | Status: DC | PRN

## 2014-09-10 MED ORDER — MEPERIDINE HCL 25 MG/ML IJ SOLN
6.2500 mg | INTRAMUSCULAR | Status: DC | PRN
Start: 2014-09-10 — End: 2014-09-10

## 2014-09-10 MED ORDER — SCOPOLAMINE 1 MG/3DAYS TD PT72: 1.0000 | MEDICATED_PATCH | Freq: Once | TRANSDERMAL | Status: DC

## 2014-09-10 MED ORDER — DEXTROSE IN LACTATED RINGERS 5 % IV SOLN
INTRAVENOUS | Status: DC | PRN
  Administered 2014-09-10: 17:00:00 via INTRAVENOUS

## 2014-09-10 MED ORDER — OXYTOCIN 40 UNITS IN LACTATED RINGERS INFUSION - SIMPLE MED: 62.5000 mL/h | INTRAVENOUS | Status: AC

## 2014-09-10 MED ORDER — OXYTOCIN 10 UNIT/ML IJ SOLN
INTRAMUSCULAR | Status: AC
  Filled 2014-09-10: qty 4

## 2014-09-10 MED ORDER — FAMOTIDINE IN NACL 20-0.9 MG/50ML-% IV SOLN
20.0000 mg | Freq: Once | INTRAVENOUS | Status: DC
  Filled 2014-09-10: qty 50

## 2014-09-10 MED ORDER — PHENYLEPHRINE 8 MG IN D5W 100 ML (0.08MG/ML) PREMIX OPTIME
INJECTION | INTRAVENOUS | Status: AC
  Filled 2014-09-10: qty 100

## 2014-09-10 MED ORDER — DIBUCAINE 1 % RE OINT: 1.0000 "application " | TOPICAL_OINTMENT | RECTAL | Status: DC | PRN

## 2014-09-10 MED ORDER — DIPHENHYDRAMINE HCL 25 MG PO CAPS: 25.0000 mg | ORAL_CAPSULE | Freq: Four times a day (QID) | ORAL | Status: DC | PRN

## 2014-09-10 MED ORDER — PRENATAL MULTIVITAMIN CH
1.0000 | ORAL_TABLET | Freq: Every day | ORAL | Status: DC
  Administered 2014-09-11 – 2014-09-13 (×3): 1 via ORAL
  Filled 2014-09-10 (×3): qty 1

## 2014-09-10 MED ORDER — OXYCODONE-ACETAMINOPHEN 5-325 MG PO TABS
1.0000 | ORAL_TABLET | ORAL | Status: DC | PRN
  Administered 2014-09-11: 1 via ORAL
  Filled 2014-09-10: qty 1

## 2014-09-10 MED ORDER — ZOLPIDEM TARTRATE 5 MG PO TABS: 5.0000 mg | ORAL_TABLET | Freq: Every evening | ORAL | Status: DC | PRN

## 2014-09-10 MED ORDER — ONDANSETRON HCL 4 MG/2ML IJ SOLN: 4.0000 mg | INTRAMUSCULAR | Status: DC | PRN

## 2014-09-10 MED ORDER — IBUPROFEN 600 MG PO TABS
600.0000 mg | ORAL_TABLET | Freq: Four times a day (QID) | ORAL | Status: DC
  Administered 2014-09-10 – 2014-09-13 (×11): 600 mg via ORAL
  Filled 2014-09-10 (×11): qty 1

## 2014-09-10 MED ORDER — ONDANSETRON HCL 4 MG PO TABS: 4.0000 mg | ORAL_TABLET | ORAL | Status: DC | PRN

## 2014-09-10 SURGICAL SUPPLY — 32 items
BARRIER ADHS 3X4 INTERCEED (GAUZE/BANDAGES/DRESSINGS) IMPLANT
BENZOIN TINCTURE PRP APPL 2/3 (GAUZE/BANDAGES/DRESSINGS) ×3 IMPLANT
CLAMP CORD UMBIL (MISCELLANEOUS) IMPLANT
CLOSURE WOUND 1/2 X4 (GAUZE/BANDAGES/DRESSINGS) ×1
CLOTH BEACON ORANGE TIMEOUT ST (SAFETY) ×3 IMPLANT
DRAPE SHEET LG 3/4 BI-LAMINATE (DRAPES) IMPLANT
DRSG OPSITE POSTOP 4X10 (GAUZE/BANDAGES/DRESSINGS) ×3 IMPLANT
DURAPREP 26ML APPLICATOR (WOUND CARE) ×3 IMPLANT
ELECT REM PT RETURN 9FT ADLT (ELECTROSURGICAL) ×3
ELECTRODE REM PT RTRN 9FT ADLT (ELECTROSURGICAL) ×1 IMPLANT
EXTRACTOR VACUUM KIWI (MISCELLANEOUS) IMPLANT
GLOVE BIO SURGEON STRL SZ 6.5 (GLOVE) ×2 IMPLANT
GLOVE BIO SURGEONS STRL SZ 6.5 (GLOVE) ×1
GLOVE BIOGEL PI IND STRL 7.0 (GLOVE) ×1 IMPLANT
GLOVE BIOGEL PI INDICATOR 7.0 (GLOVE) ×2
GOWN STRL REUS W/TWL LRG LVL3 (GOWN DISPOSABLE) ×6 IMPLANT
KIT ABG SYR 3ML LUER SLIP (SYRINGE) IMPLANT
NEEDLE HYPO 25X5/8 SAFETYGLIDE (NEEDLE) IMPLANT
NS IRRIG 1000ML POUR BTL (IV SOLUTION) ×3 IMPLANT
PACK C SECTION WH (CUSTOM PROCEDURE TRAY) ×3 IMPLANT
PAD OB MATERNITY 4.3X12.25 (PERSONAL CARE ITEMS) ×3 IMPLANT
RETRACTOR WND ALEXIS 25 LRG (MISCELLANEOUS) ×1 IMPLANT
RTRCTR WOUND ALEXIS 25CM LRG (MISCELLANEOUS) ×3
STRIP CLOSURE SKIN 1/2X4 (GAUZE/BANDAGES/DRESSINGS) ×2 IMPLANT
SUT VIC AB 0 CT1 36 (SUTURE) ×18 IMPLANT
SUT VIC AB 2-0 CT1 27 (SUTURE) ×2
SUT VIC AB 2-0 CT1 TAPERPNT 27 (SUTURE) ×1 IMPLANT
SUT VIC AB 4-0 KS 27 (SUTURE) ×3 IMPLANT
SUT VIC AB 4-0 PS2 27 (SUTURE) ×3 IMPLANT
TOWEL OR 17X24 6PK STRL BLUE (TOWEL DISPOSABLE) ×3 IMPLANT
TRAY FOLEY CATH 14FR (SET/KITS/TRAYS/PACK) IMPLANT
WATER STERILE IRR 1000ML POUR (IV SOLUTION) ×3 IMPLANT

## 2014-09-10 NOTE — Progress Notes (Signed)
[employer] interpreter [phone].

## 2014-09-10 NOTE — Plan of Care (Signed)
Problem: Phase I Progression Outcomes Goal: Foley catheter patent Outcome: Completed/Met Date Met:  09/10/14 Goal: VS, stable, temp < 100.4 degrees F Outcome: Completed/Met Date Met:  09/10/14 Goal: Initial discharge plan identified Outcome: Completed/Met Date Met:  09/10/14

## 2014-09-10 NOTE — Op Note (Signed)
Penny Barber PROCEDURE DATE: 09/10/2014  PREOPERATIVE DIAGNOSES: Intrauterine pregnancy at 5370w1d weeks gestation; oligohydramnios, breech presentation  POSTOPERATIVE DIAGNOSES: The same  PROCEDURE: Repeat Low Transverse Cesarean Section  SURGEON:  Dr. Scheryl DarterJames Arnold  ASSISTANT:  Dr. Fredirick LatheKristy Lehua Flores  ANESTHESIOLOGIST: Dr. Malen GauzeFoster  INDICATIONS: Penny Barber is a 28 y.o. [id number] at 2470w1d here for cesarean section secondary to the indications listed under preoperative diagnoses, pt was found to have oligohydramnios on ultrasound performed today underwent failed ECV yesterday; please see preoperative note for further details.  The risks of cesarean section were discussed with the patient including but were not limited to: bleeding which may require transfusion or reoperation; infection which may require antibiotics; injury to bowel, bladder, ureters or other surrounding organs; injury to the fetus; need for additional procedures including hysterectomy in the event of a life-threatening hemorrhage; placental abnormalities wth subsequent pregnancies, incisional problems, thromboembolic phenomenon and other postoperative/anesthesia complications.   The patient concurred with the proposed plan, giving informed written consent for the procedure.    FINDINGS:  Viable female infant in cephalic presentation.  Apgars 7 and 9.  Clear amniotic fluid.  Intact placenta, three vessel cord.  Normal uterus, fallopian tubes and ovaries bilaterally.  ANESTHESIA: Spinal INTRAVENOUS FLUIDS: 1500 ml ESTIMATED BLOOD LOSS: 600 ml URINE OUTPUT:  100 ml SPECIMENS: Placenta sent to pathology COMPLICATIONS: None immediate  PROCEDURE IN DETAIL:  The patient preoperatively received intravenous antibiotics and had sequential compression devices applied to her lower extremities.  She was then taken to the operating room where spinal anesthesia was administered and was found to be adequate. She was then placed in a dorsal  supine position with a leftward tilt, and prepped and draped in a sterile manner.  A foley catheter was placed into her bladder and attached to constant gravity.  After an adequate timeout was performed, a Pfannenstiel skin incision was made with scalpel and carried through to the underlying layer of fascia. Dense fascia was incised in the midline, and this incision was extended bilaterally using the Mayo scissors.  Kocher clamps were applied to the superior aspect of the fascial incision and the underlying rectus muscles were dissected off bluntly. A similar process was carried out on the inferior aspect of the fascial incision. The rectus muscles were separated in the midline bluntly and the peritoneum was entered bluntly. Bladder flap was created with metzenbaums 2/2 minimal vesicouterine adhesions.  Attention was turned to the lower uterine segment where a low transverse hysterotomy was made with a scalpel carefully and extended bilaterally bluntly, buttox was nicked with scalpel.  Weston Brassick was hemostatic.  The infant was successfully delivered legs first, the cord was clamped and cut and the infant was handed over to awaiting neonatology team. Uterine massage was then administered, and the placenta delivered intact with a three-vessel cord. The uterus was then cleared of clot and debris.  The hysterotomy was closed with 0 Vicryl in a running locked fashion, and an imbricating layer was also placed with 0 Vicryl. The pelvis was cleared of all clot and debris. Hemostasis was confirmed on all surfaces.  The peritoneum and the muscles were reapproximated using 0 Vicryl interrupted stitches. The fascia was then closed using 0 Vicryl in a running fashion.  The subcutaneous layer was irrigated, and 30 ml of 0.5% Marcaine was injected subcutaneously around the incision.  The skin was closed with a 4-0 Vicryl subcuticular stitch. The patient tolerated the procedure well. Sponge, lap, instrument and needle counts were  correct x 2.  She was taken to the recovery room in stable condition.   Penny Barber,Penny Fleissner ROCIO, MD 7:21 PM

## 2014-09-10 NOTE — Progress Notes (Signed)
New diagnosis of oligohydramnios today, AFI 3 cm, delivery indicated. Breech presentation, cesarean delivery indicated.  RCS scheduled today at 1600, she will report to MAU at 1430 for preoperative preparation NST performed today was reviewed and was found to be reactive.   Dr. Debroah LoopArnold (OB Attending on call) and MAU notified.  Encounter done with help of language line interpreter.

## 2014-09-10 NOTE — Transfer of Care (Signed)
Immediate Anesthesia Transfer of Care Note  Patient: Penny Barber  Procedure(s) Performed: Procedure(s): CESAREAN SECTION (N/A)  Patient Location: PACU  Anesthesia Type:Spinal  Level of Consciousness: awake  Airway & Oxygen Therapy: Patient Spontanous Breathing  Post-op Assessment: Report given to PACU RN  Post vital signs: Reviewed and stable  Complications: No apparent anesthesia complications

## 2014-09-10 NOTE — Patient Instructions (Signed)
Return to clinic for any obstetric concerns or go to [facility] for evaluation  

## 2014-09-10 NOTE — Anesthesia Postprocedure Evaluation (Deleted)
  Anesthesia Post Note  Patient: Penny Barber  Procedure(s) Performed: Procedure(s) (LRB): CESAREAN SECTION (N/A)  Anesthesia type: GA  Patient location: PACU  Post pain: Pain level controlled  Post assessment: Post-op Vital signs reviewed  Last Vitals:  Filed Vitals:   09/10/14 1900  BP: 85/61  Pulse: 75  Temp:   Resp: 22    Post vital signs: Reviewed  Level of consciousness: sedated  Complications: No apparent anesthesia complications

## 2014-09-10 NOTE — Anesthesia Preprocedure Evaluation (Signed)
Anesthesia Evaluation  Patient identified by MRN, date of birth, ID band Patient awake    Reviewed: Allergy & Precautions, H&P , NPO status , Patient's Chart, lab work & pertinent test results  Airway Mallampati: III  TM Distance: >3 FB Neck ROM: Full    Dental no notable dental hx. (+) Teeth Intact   Pulmonary neg pulmonary ROS,  breath sounds clear to auscultation  Pulmonary exam normal       Cardiovascular negative cardio ROS  Rhythm:Regular Rate:Normal     Neuro/Psych negative neurological ROS  negative psych ROS   GI/Hepatic Neg liver ROS, GERD-  ,  Endo/Other  diabetes, Well Controlled, Gestational, Oral Hypoglycemic AgentsObesity  Renal/GU negative Renal ROS  negative genitourinary   Musculoskeletal negative musculoskeletal ROS (+)   Abdominal (+) + obese,   Peds  Hematology negative hematology ROS (+)   Anesthesia Other Findings   Reproductive/Obstetrics (+) Pregnancy Previous C/Section Oligohydramnios                             Anesthesia Physical Anesthesia Plan  ASA: II and emergent  Anesthesia Plan: Spinal   Post-op Pain Management:    Induction:   Airway Management Planned: Natural Airway  Additional Equipment:   Intra-op Plan:   Post-operative Plan:   Informed Consent: I have reviewed the patients History and Physical, chart, labs and discussed the procedure including the risks, benefits and alternatives for the proposed anesthesia with the patient or authorized representative who has indicated his/her understanding and acceptance.     Plan Discussed with: Anesthesiologist, CRNA and Surgeon  Anesthesia Plan Comments:         Anesthesia Quick Evaluation

## 2014-09-10 NOTE — Addendum Note (Signed)
Addendum  created 09/10/14 1923 by Brayton CavesFreeman Buena Boehm, MD   Modules edited: Notes Section   Notes Section:  File: [id number]; Delete: [id number]

## 2014-09-10 NOTE — H&P (Signed)
LABOR ADMISSION HISTORY AND PHYSICAL  Penny Barber is a 28 y.o. female [id number] with IUP at 5565w0d here for cesarean section 2/2 breech presentation and oligohydramnios.   Sono:   3670w1d 53% EFW 38.1 oligo 3.9cm   Prenatal History/Complications:  Past Medical History: Past Medical History  Diagnosis Date  . Medical history non-contributory   . Varicose veins   . Anxiety     when heart skips a beat    Past Surgical History: Past Surgical History  Procedure Laterality Date  . Cesarean section      Obstetrical History: OB History    Gravida Para Term Preterm AB TAB SAB Ectopic Multiple Living   3 2 1 1      2       Social History: History   Social History  . Marital Status: Married    Spouse Name: N/A    Number of Children: N/A  . Years of Education: N/A   Social History Main Topics  . Smoking status: Never Smoker   . Smokeless tobacco: Never Used  . Alcohol Use: No  . Drug Use: No  . Sexual Activity: Yes    Birth Control/ Protection: None   Other Topics Concern  . None   Social History Narrative   ** Merged History Encounter **        Family History: Family History  Problem Relation Age of Onset  . Alcohol abuse Neg Hx   . Arthritis Neg Hx   . Asthma Neg Hx   . Birth defects Neg Hx   . Cancer Neg Hx   . COPD Neg Hx   . Depression Neg Hx   . Diabetes Neg Hx   . Drug abuse Neg Hx   . Early death Neg Hx   . Hearing loss Neg Hx   . Heart disease Neg Hx   . Hyperlipidemia Neg Hx   . Hypertension Neg Hx   . Kidney disease Neg Hx   . Learning disabilities Neg Hx   . Mental illness Neg Hx   . Mental retardation Neg Hx   . Miscarriages / Stillbirths Neg Hx   . Stroke Neg Hx   . Vision loss Neg Hx   . Varicose Veins Neg Hx     Allergies: No Known Allergies  Prescriptions prior to admission  Medication Sig Dispense Refill Last Dose  . ACCU-CHEK FASTCLIX LANCETS MISC Inject 1 each into the skin 4 (four) times daily. [phone] for testing 4 times  daily 102 each 12 Taking  . glucose blood test strip Use as instructed to test blood sugar 4 times daily. 50 each 12 Taking  . glyBURIDE (DIABETA) 2.5 MG tablet 1 tablet with breakfast and 0.5 tablet at bedtime 30 tablet 1 Taking  . Prenatal Vit-Fe Fumarate-FA (PRENATAL MULTIVITAMIN) TABS tablet Take 1 tablet by mouth daily at 12 noon. 30 tablet 3 Taking     Review of Systems   All systems reviewed and negative except as stated in HPI  Last menstrual period 11/10/2013. General appearance: alert and cooperative Lungs: clear to auscultation bilaterally Heart: regular rate and rhythm Abdomen: soft, non-tender; bowel sounds normal Extremities: Homans sign is negative, no sign of DVT Presentation: breech Fetal monitoringBaseline: 135 bpm, Variability: Good {> 6 bpm), Accelerations: Reactive and Decelerations: Absent Uterine activityNone     Prenatal labs: ABO, Rh: --/--/A POS, A POS (12/02 0905) Antibody: NEG (12/02 0905) Rubella:   RPR: NON REAC (09/18 1153)  HBsAg: NEGATIVE (07/22 1329)  HIV: NONREACTIVE (  09/18 1153)  GBS: Negative (11/23 0000)  1 hr Glucola 123, 157 3h gtt 92/196/173/130 Genetic screening  Not done Anatomy US oligo on follow up   [facility]  Dating   Genetic Screen 1 Screen:                 AFP:                    Quad:                  NIPS:  Anatomic US  Normal but incomplete  >> needs F/U  GTT Early:    123           Third trimester:   TDaP vaccine 06/26/14  Flu vaccine 06/26/14  GBS Negative  Contraception   Baby Food  Breast  Circumcision  NA  Pediatrician   Support Person      Results for orders placed or performed in visit on 09/10/14 (from the past 24 hour(s))  POCT urinalysis dip (device)   Collection Time: 09/10/14  9:35 AM  Result Value Ref Range   Glucose, UA 100 (A) NEGATIVE mg/dL   Bilirubin Urine NEGATIVE NEGATIVE   Ketones, ur NEGATIVE NEGATIVE mg/dL   Specific Gravity, Urine 1.020 1.005 - 1.030   Hgb urine dipstick NEGATIVE  NEGATIVE   pH 5.5 5.0 - 8.0   Protein, ur NEGATIVE NEGATIVE mg/dL   Urobilinogen, UA 0.2 0.0 - 1.0 mg/dL   Nitrite NEGATIVE NEGATIVE   Leukocytes, UA NEGATIVE NEGATIVE    Patient Active Problem List   Diagnosis Date Noted  . Oligohydramnios 09/10/2014  . Oligohydramnios antepartum   . Breech presentation 09/09/2014  . Gestational diabetes mellitus, antepartum 07/03/2014  . History of cesarean delivery, antepartum 04/30/2014  . Rubella non-immune status, antepartum 04/30/2014  . Language barrier, cultural differences 04/30/2014  . IFG (impaired fasting glucose) 01/27/2014  . Back pain 09/24/2013  . Abdominal pain, left lower quadrant 09/24/2013    Assessment: Penny RusselYiftusira Brummitt is a 28 y.o. G3P1102 at 8075w0d with A2DM on glyburide with previous cesarean section 2/2 fetal distress (with initial vaginal delivery) s/p failed ECV here for scheduled c/s 2/2 oligo on ultrasound today  After informed consent with Amharic pacific interpreter, The risks of cesarean section discussed with the patient included but were not limited to: bleeding which may require transfusion or reoperation; infection which may require antibiotics; injury to bowel, bladder, ureters or other surrounding organs; injury to the fetus; need for additional procedures including hysterectomy in the event of a life-threatening hemorrhage; placental abnormalities wth subsequent pregnancies, incisional problems, thromboembolic phenomenon and other postoperative/anesthesia complications. The patient concurred with the proposed plan, giving informed written consent for the procedure.   Patient will remain NPO for procedure. Anesthesia and OR aware. Preoperative prophylactic antibiotics and SCDs ordered on call to the OR.  To OR when ready.   Yong Grieser ROCIO 09/10/2014, 3:36 PM

## 2014-09-10 NOTE — MAU Note (Signed)
Pt preping for OR

## 2014-09-10 NOTE — Anesthesia Postprocedure Evaluation (Signed)
  Anesthesia Post Note  Patient: Penny Barber  Procedure(s) Performed: Procedure(s) (LRB): CESAREAN SECTION (N/A)  Anesthesia type: Spinal  Patient location: PACU  Post pain: Pain level controlled  Post assessment: Post-op Vital signs reviewed  Last Vitals:  Filed Vitals:   09/10/14 1900  BP: 85/61  Pulse: 75  Temp:   Resp: 22    Post vital signs: Reviewed  Level of consciousness: awake  Complications: No apparent anesthesia complications

## 2014-09-11 ENCOUNTER — Encounter (HOSPITAL_COMMUNITY): Payer: Self-pay | Admitting: Obstetrics & Gynecology

## 2014-09-11 LAB — CBC WITH DIFFERENTIAL/PLATELET
Basophils Absolute: 0 10*3/uL (ref 0.0–0.1)
Basophils Relative: 0 % (ref 0–1)
Eosinophils Absolute: 0.1 10*3/uL (ref 0.0–0.7)
Eosinophils Relative: 1 % (ref 0–5)
HCT: 35.6 % — ABNORMAL LOW (ref 36.0–46.0)
HEMOGLOBIN: 11.7 g/dL — AB (ref 12.0–15.0)
Lymphocytes Relative: 28 % (ref 12–46)
Lymphs Abs: 1.9 10*3/uL (ref 0.7–4.0)
MCH: 29.8 pg (ref 26.0–34.0)
MCHC: 32.9 g/dL (ref 30.0–36.0)
MCV: 90.6 fL (ref 78.0–100.0)
MONO ABS: 0.4 10*3/uL (ref 0.1–1.0)
MONOS PCT: 6 % (ref 3–12)
Neutro Abs: 4.5 10*3/uL (ref 1.7–7.7)
Neutrophils Relative %: 65 % (ref 43–77)
Platelets: 178 10*3/uL (ref 150–400)
RBC: 3.93 MIL/uL (ref 3.87–5.11)
RDW: 12.8 % (ref 11.5–15.5)
WBC: 6.9 10*3/uL (ref 4.0–10.5)

## 2014-09-11 MED ORDER — NALOXONE HCL 0.4 MG/ML IJ SOLN: 0.4000 mg | INTRAMUSCULAR | Status: DC | PRN

## 2014-09-11 MED ORDER — ONDANSETRON HCL 4 MG/2ML IJ SOLN: 4.0000 mg | Freq: Three times a day (TID) | INTRAMUSCULAR | Status: DC | PRN

## 2014-09-11 MED ORDER — SODIUM CHLORIDE 0.9 % IJ SOLN: 3.0000 mL | INTRAMUSCULAR | Status: DC | PRN

## 2014-09-11 MED ORDER — NALBUPHINE HCL 10 MG/ML IJ SOLN: 5.0000 mg | INTRAMUSCULAR | Status: DC | PRN

## 2014-09-11 MED ORDER — DIPHENHYDRAMINE HCL 50 MG/ML IJ SOLN: 12.5000 mg | INTRAMUSCULAR | Status: DC | PRN

## 2014-09-11 MED ORDER — NALOXONE HCL 1 MG/ML IJ SOLN: 1.0000 ug/kg/h | INTRAVENOUS | Status: DC | PRN

## 2014-09-11 MED ORDER — NALBUPHINE HCL 10 MG/ML IJ SOLN: 5.0000 mg | Freq: Once | INTRAMUSCULAR | Status: AC | PRN

## 2014-09-11 MED ORDER — DIPHENHYDRAMINE HCL 25 MG PO CAPS: 25.0000 mg | ORAL_CAPSULE | ORAL | Status: DC | PRN

## 2014-09-11 NOTE — Lactation Note (Signed)
This note was copied from the chart of [name]. Lactation Consultation Note  Patient Name: [name] [id number]'s Date: 09/11/2014 Reason for consult: Follow-up assessment;Other (Comment) (some Down features) but baby has been latching well at some feedings, per mom and RN report.  Baby is also receiving some formula supplement via syringe.  FOB present and able to translate.  LC encouraged cue feedings at breast and supplement as needed, if baby not latching well for sustained sucking with swallows.  LC also encouraged mom to use DEBP for additional stimulation after feedings and may feed ebm as available.  LC reviewed why it is difficult to achieve milk flow with pump during colostrum stage but that baby, if sucking strong, is able to obtain mother's milk.   Maternal Data    Feeding Feeding Type: Breast Fed  LATCH Score/Interventions           LATCH score=7 per LC earlier today           Lactation Tools Discussed/Used Tools: 69F feeding tube / Syringe Cue feedings DEBP for additional stimulation of milk supply  Consult Status Consult Status: Follow-up Date: 09/12/14 Follow-up type: In-patient    Penny Barber, Penny Barber [facility] 09/11/2014, 10:42 PM

## 2014-09-11 NOTE — Lactation Note (Signed)
This note was copied from the chart of Penny Henry RusselYiftusira Pitcher. Lactation Consultation Note Experienced BF mom for 1 yr. And 7 months. Denies difficulties. FOB translator to mom. RN states baby BF well. Mom holding baby STS under gown. 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 Explained the baby could tire easily . Suckling at breast well, but I wanted to stimulate breast more. Demonstrated pumping. Encouraged post-pumping for 15 min. Mom shown how to use DEBP & how to disassemble, clean, & reassemble parts. Educated about newborn behavior. Mom encouraged to feed baby w/feeding cues. Mom knows to pump q3h for 15-20 min. Encouraged to call for assistance if needed and to verify proper latch. Hand expression taught to Mom. Referred to Baby and Me Book in Breastfeeding section Pg. 22-23 for position options and Proper latch demonstration. Mom encouraged to do skin-to-skin. Mom encouraged to waken baby for feeds. Will monitor I&O. Parents not filling out sheet.  Patient Name: Penny Barber ZOXWR'UToday's Date: 09/11/2014 Reason for consult: Initial assessment   Maternal Data Has patient been taught Hand Expression?: Yes Does the patient have breastfeeding experience prior to this delivery?: Yes  Feeding Feeding Type: Breast Fed Length of feed: 20 min  LATCH Score/Interventions Latch: Repeated attempts needed to sustain latch, nipple held in mouth throughout feeding, stimulation needed to elicit sucking reflex. Intervention(s): Adjust position;Assist with latch;Breast massage;Breast compression  Audible Swallowing: A few with stimulation Intervention(s): Skin to skin;Hand expression Intervention(s): Alternate breast massage  Type of Nipple: Everted at rest and after stimulation  Comfort (Breast/Nipple): Soft / non-tender     Hold (Positioning): Assistance needed to correctly position infant at breast and maintain latch. Intervention(s): Breastfeeding basics reviewed;Support Pillows;Position  options;Skin to skin  LATCH Score: 7  Lactation Tools Discussed/Used Tools: Pump Breast pump type: Double-Electric Breast Pump Pump Review: Setup, frequency, and cleaning;Milk Storage Initiated by:: Peri JeffersonL. Emmalea Treanor RN Date initiated:: 09/11/14   Consult Status Consult Status: Follow-up Date: 09/12/13 Follow-up type: In-patient    Charyl DancerCARVER, Penny Barber 09/11/2014, 6:58 AM

## 2014-09-11 NOTE — Anesthesia Postprocedure Evaluation (Signed)
  Anesthesia Post-op Note  Patient: Penny Barber  Procedure(s) Performed: Procedure(s): CESAREAN SECTION (N/A)  Patient Location: Mother/Baby  Anesthesia Type:Spinal  Level of Consciousness: awake and alert   Airway and Oxygen Therapy: Patient Spontanous Breathing  Post-op Pain: mild  Post-op Assessment: Post-op Vital signs reviewed, Patient's Cardiovascular Status Stable, Respiratory Function Stable, No signs of Nausea or vomiting, Pain level controlled, No headache, No residual numbness and No residual motor weakness  Post-op Vital Signs: Reviewed  Last Vitals:  Filed Vitals:   09/11/14 0900  BP: 92/50  Pulse: 69  Temp: 36.7 C  Resp: 20    Complications: No apparent anesthesia complications

## 2014-09-11 NOTE — Progress Notes (Signed)
Amharic interpreter contacted in order to do assessment, update babies intake and output, and explain what happens between 11-12 with medications and assessing the baby.

## 2014-09-11 NOTE — Progress Notes (Signed)
Patient reports she is dizzy when getting up, patient up with steady to BR

## 2014-09-11 NOTE — Addendum Note (Signed)
Addendum  created 09/11/14 0948 by Angela Adamana G Damariz Paganelli, CRNA   Modules edited: Notes Section   Notes Section:  File: [id number]

## 2014-09-11 NOTE — Progress Notes (Signed)
Postpartum Day 1: Repeat Cesarean Delivery at 9554w1d for oligohydramnios, breech presentation in setting of GDM.  Subjective: Patient reports incisional pain and tolerating PO. Foley still in place. No OOB yet, no flatus. Breastfeeding infant in bed; baby is doing well   Objective: Vital signs in last 24 hours: Temp:  [97.4 F (36.3 C)-98.8 F (37.1 C)] 97.7 F (36.5 C) (12/04 0445) Pulse Rate:  [39-92] 56 (12/04 0445) Resp:  [16-30] 20 (12/04 0445) BP: (75-129)/(37-95) 75/38 mmHg (12/04 0445) SpO2:  [97 %-100 %] 99 % (12/04 0445) Weight:  [184 lb 9.6 oz (83.734 kg)] 184 lb 9.6 oz (83.734 kg) (12/03 [id number])  Physical Exam:  General: alert and no distress Lochia: appropriate Uterine Fundus: firm Incision: healing well, some old significant drainage on honeycomb dressing DVT Evaluation: No evidence of DVT seen on physical exam. Negative Homan's sign.   Recent Labs  09/09/14 0905 09/10/14 1535  HGB 12.7 13.8  HCT 37.4 41.9    Assessment/Plan: Status post Cesarean section. Doing well postoperatively.  Remove foley, follow up postoperative CBC. Continue routine postpartum care.  Ena Demary A, MD 09/11/2014, 7:21 AM

## 2014-09-12 MED ORDER — IBUPROFEN 600 MG PO TABS: 600.0000 mg | ORAL_TABLET | Freq: Four times a day (QID) | ORAL | Status: DC | PRN

## 2014-09-12 MED ORDER — MEASLES, MUMPS & RUBELLA VAC ~~LOC~~ INJ
0.5000 mL | INJECTION | Freq: Once | SUBCUTANEOUS | Status: AC
  Administered 2014-09-13: 0.5 mL via SUBCUTANEOUS
  Filled 2014-09-12 (×2): qty 0.5

## 2014-09-12 MED ORDER — OXYCODONE-ACETAMINOPHEN 5-325 MG PO TABS: 1.0000 | ORAL_TABLET | ORAL | Status: DC | PRN

## 2014-09-12 MED ORDER — MEASLES, MUMPS & RUBELLA VAC ~~LOC~~ INJ
0.5000 mL | INJECTION | Freq: Once | SUBCUTANEOUS | Status: DC
  Filled 2014-09-12 (×2): qty 0.5

## 2014-09-12 NOTE — Progress Notes (Signed)
Patient seen by CSW.  Note to follow. 

## 2014-09-12 NOTE — Plan of Care (Signed)
Problem: Consults Goal: Diabetes Guidelines if Diabetic/Glucose > 140 If diabetic or lab glucose is > 140 mg/dl - Initiate Diabetes/Hyperglycemia Guidelines & Document Interventions  Outcome: Not Applicable Date Met:  21/97/58  Problem: Discharge Progression Outcomes Goal: Tolerating diet Outcome: Completed/Met Date Met:  83/25/49 Goal: Complications resolved/controlled Outcome: Completed/Met Date Met:  09/12/14 Goal: Discharge plan in place and appropriate Outcome: Completed/Met Date Met:  09/12/14

## 2014-09-12 NOTE — Plan of Care (Signed)
Problem: Discharge Progression Outcomes Goal: Barriers To Progression Addressed/Resolved Outcome: Completed/Met Date Met:  09/12/14 Goal: Pain controlled with appropriate interventions Outcome: Completed/Met Date Met:  09/12/14

## 2014-09-12 NOTE — Discharge Summary (Signed)
Obstetric Discharge Summary Reason for Admission: cesarean section Prenatal Procedures: none Intrapartum Procedures: cesarean: low cervical, transverse Postpartum Procedures: none Complications-Operative and Postpartum: none HEMOGLOBIN  Date Value Ref Range Status  09/11/2014 11.7* 12.0 - 15.0 g/dL Final   HCT  Date Value Ref Range Status  09/11/2014 35.6* 36.0 - 46.0 % Final   [name] is a 28yo [id number] @ 38.1 admitted for rLTCS due to breech and oligo. She tolerated the procedure well and by POD#2 is deemed to have received the full benefit of her hospital stay and will be discharged home. She is breast and bottlefeeding and declines contraception at this time. She will need a 2hr glucola at her PP visit due to A2DM.  Physical Exam:  General: alert, cooperative and mild distress  Heart: RRR Lungs: nl effort Lochia: appropriate Uterine Fundus: firm Incision: honeycomb dsg intact, stained and unchanged DVT Evaluation: No evidence of DVT seen on physical exam.  Discharge Diagnoses: Term Pregnancy-delivered  Discharge Information: Date: 09/12/2014 Activity: pelvic rest Diet: routine Medications: PNV, Ibuprofen and Percocet Condition: stable Instructions: refer to practice specific booklet Discharge to: home Follow-up Information    [facility] Schedule an appointment as soon as possible for a visit in 6 weeks.   Why:  For your postpartum appointment.   Contact information:   [address] [place] [id number] [phone]      Newborn Data: Live born female  Birth Weight: 6 lb 11.6 oz (3050 g) APGAR: 7, 8  Home with mother.  Cam HaiSHAW, Penny Barber CNM 09/12/2014, 9:10 AM

## 2014-09-12 NOTE — Discharge Instructions (Signed)

## 2014-09-12 NOTE — Plan of Care (Signed)
Problem: Consults Goal: Postpartum Patient Education (See Patient Education module for education specifics.)  Outcome: Completed/Met Date Met:  09/12/14  Problem: Discharge Progression Outcomes Goal: Other Discharge Outcomes/Goals Outcome: Not Applicable Date Met:  15/94/70

## 2014-09-12 NOTE — Plan of Care (Signed)
Problem: Discharge Progression Outcomes Goal: Activity appropriate for discharge plan Outcome: Completed/Met Date Met:  09/12/14 Goal: Afebrile, VS remain stable at discharge Outcome: Completed/Met Date Met:  09/12/14 Goal: Remove staples per MD order Outcome: Not Applicable Date Met:  28/20/81 Goal: MMR given as ordered Outcome: Completed/Met Date Met:  09/12/14

## 2014-09-13 NOTE — Discharge Summary (Signed)
Obstetric Discharge Summary Reason for Admission: cesarean section Prenatal Procedures: none Intrapartum Procedures: cesarean: low cervical, transverse Postpartum Procedures: none Complications-Operative and Postpartum: none HEMOGLOBIN  Date Value Ref Range Status  09/11/2014 11.7* 12.0 - 15.0 g/dL Final   HCT  Date Value Ref Range Status  09/11/2014 35.6* 36.0 - 46.0 % Final    Physical Exam:  General: alert, cooperative, appears stated age and no distress Lochia: appropriate Uterine Fundus: firm Incision: healing well, no significant drainage, no dehiscence, no significant erythema DVT Evaluation: No evidence of DVT seen on physical exam.  Discharge Diagnoses: Term Pregnancy-delivered  Discharge Information: Date: 09/13/2014 Activity: pelvic rest Diet: routine Medications: Ibuprofen, Colace, Iron and Percocet Condition: stable Instructions: refer to practice specific booklet Discharge to: home Follow-up Information    [facility] Schedule an appointment as soon as possible for a visit in 6 weeks.   Why:  For your postpartum appointment.   Contact information:   [address] [place] [id number] [phone]      Newborn Data: Live born female  Birth Weight: 6 lb 11.6 oz (3050 g) APGAR: 7, 8  Home with mother.  Benjamin Stainhompson, McKenzie L 09/13/2014, 7:23 AM   I spoke with and examined patient and agree with resident/PA/SNM's note and plan of care.  Baby likely with T21.  Cheral MarkerKimberly R. Arvel Oquinn, CNM, [facility]NP-BC 09/13/2014 8:18 AM

## 2014-09-13 NOTE — Lactation Note (Signed)
This note was copied from the chart of Girl Henry RusselYiftusira Yahnke. Lactation Consultation Note  Follow up visit prior to discharge.  Amharic interpreter used by phone.  Mom states she doesn't have milk yet so supplementing with formula.  Observed baby obtain good latch and suck actively on and off for 10 minutes.  Baby does slip off at times and mom relatches baby.  Mom shown milk around baby's mouth.  Breasts feel full.  After baby came off breast I assisted mom with using the DEBP.  She obtained 20 mls from right and 30 mls from left.  Parents know WIC will contact them tomorrow for pump.  Referral faxed.  Instructions given to breastfeed with cues, Pump x 15 minutes after feeding and supplement with breast milk per bottle as desired per baby.  Manual pump given with instructions.  FOB shown EBM storage guidelines.  Patient Name: Girl Henry RusselYiftusira Ida YNWGN'FToday's Date: 09/13/2014 Reason for consult: Follow-up assessment;Other (Comment) (downs syndrome)   Maternal Data    Feeding Feeding Type: Breast Fed Length of feed: 10 min  LATCH Score/Interventions Latch: Repeated attempts needed to sustain latch, nipple held in mouth throughout feeding, stimulation needed to elicit sucking reflex.  Audible Swallowing: Spontaneous and intermittent Intervention(s): Alternate breast massage  Type of Nipple: Everted at rest and after stimulation  Comfort (Breast/Nipple): Soft / non-tender     Hold (Positioning): No assistance needed to correctly position infant at breast.  LATCH Score: 9  Lactation Tools Discussed/Used Breast pump type: Double-Electric Breast Pump   Consult Status Consult Status: Complete    Rohini Jaroszewski S 09/13/2014, 11:01 AM

## 2014-09-13 NOTE — Progress Notes (Signed)
Clinical Social Work Department PSYCHOSOCIAL ASSESSMENT - MATERNAL/CHILD 09/13/2014  Patient:  Penny Barber,Penny Barber  Account Number:  [id number]  Admit Date:  09/10/2014  Childs Name:   Penny Barber    Clinical Social Worker:  Jaquelinne Glendening, LCSW   Date/Time:  09/12/2014 04:00 PM  Date Referred:  09/11/2014      Referred reason  Psychosocial assessment   Other referral source:    I:  FAMILY / HOME ENVIRONMENT Child's legal guardian:  PARENT  Guardian - Name Guardian - Age Guardian - Address  Penny Barber,Penny Barber 28 [address]  [place], [place] [address]  Barber, Penny Barber  same as above   Other household support members/support persons Other support:    II  PSYCHOSOCIAL DATA Information Source:    Financial and Community Resources Employment:   Spouse is employed   Financial resources:  Medicaid If Medicaid - County:   Other  WIC  Food Stamps   School / Grade:   Maternity Care Coordinator / Child Services Coordination / Early Interventions:  Cultural issues impacting care:    III  STRENGTHS Strengths  Home prepared for Child (including basic supplies)   Strength comment:    IV  RISK FACTORS AND CURRENT PROBLEMS Current Problem:       V  SOCIAL WORK ASSESSMENT Acknowledged order for social work consult.  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   Met with mother who was pleasant and receptive to CSW intervention.  She had a friend present with her.  Parents are married and have 3 other dependents ages 7,6, and 2.   They are from [place] and speak Amharic.  Snait (ID# [id number]) with the language line facilitated the interview.  Mother is aware of newborn's diagnoses and asked about available financial resources.  Informed that she has spoken with the Physicians about newborn's diagnoses and did not have any further questions about down syndrome during CSW visit.  She reports having all needed baby supplies. Informed that she may have some issues with obtaining a car  seat, but she believes she may have one (spouse is taking care of it).  Informed her about the car seat program at [facility].  Spoke to her regarding SSI.  Spouse is employed part time.   She denies hx of mental illness or substance abuse.   Mother informed of social work availability.      VI SOCIAL WORK PLAN Social Work Plan  No Further Intervention Required / No Barriers to Discharge   Type of pt/family education:   Discussed SSI and referred mother to social security to apply for SSI  Provided information to mother for patient accounts  Informed mother about the car seat program at [facility]    

## 2014-09-14 ENCOUNTER — Other Ambulatory Visit: Payer: Medicaid Other

## 2014-09-14 NOTE — Progress Notes (Signed)
Post discharge ur review completed. 

## 2014-09-15 NOTE — Addendum Note (Signed)
Addendum  created 09/15/14 1300 by Tyrone AppleMichael A. Malen GauzeFoster, MD   Modules edited: Anesthesia Responsible Staff

## 2014-09-18 ENCOUNTER — Encounter (HOSPITAL_COMMUNITY): Payer: Self-pay

## 2014-09-18 ENCOUNTER — Inpatient Hospital Stay (HOSPITAL_COMMUNITY)
Admission: AD | Admit: 2014-09-18 | Discharge: 2014-09-19 | Disposition: A | Discharge status: Home / Self Care | Payer: Medicaid Other | Source: Ambulatory Visit | Attending: Obstetrics and Gynecology | Admitting: Obstetrics and Gynecology

## 2014-09-18 DIAGNOSIS — O9989 Other specified diseases and conditions complicating pregnancy, childbirth and the puerperium: Secondary | ICD-10-CM | POA: Insufficient documentation

## 2014-09-18 DIAGNOSIS — K210 Gastro-esophageal reflux disease with esophagitis: Secondary | ICD-10-CM | POA: Insufficient documentation

## 2014-09-18 DIAGNOSIS — L7682 Other postprocedural complications of skin and subcutaneous tissue: Secondary | ICD-10-CM

## 2014-09-18 DIAGNOSIS — K219 Gastro-esophageal reflux disease without esophagitis: Secondary | ICD-10-CM

## 2014-09-18 DIAGNOSIS — R197 Diarrhea, unspecified: Secondary | ICD-10-CM | POA: Diagnosis present

## 2014-09-18 LAB — URINALYSIS, ROUTINE W REFLEX MICROSCOPIC
BILIRUBIN URINE: NEGATIVE
Glucose, UA: NEGATIVE mg/dL
KETONES UR: NEGATIVE mg/dL
NITRITE: NEGATIVE
PH: 5.5 (ref 5.0–8.0)
Protein, ur: NEGATIVE mg/dL
Specific Gravity, Urine: 1.015 (ref 1.005–1.030)
Urobilinogen, UA: 0.2 mg/dL (ref 0.0–1.0)

## 2014-09-18 LAB — CBC
HCT: 31.3 % — ABNORMAL LOW (ref 36.0–46.0)
Hemoglobin: 10.4 g/dL — ABNORMAL LOW (ref 12.0–15.0)
MCH: 30.1 pg (ref 26.0–34.0)
MCHC: 33.2 g/dL (ref 30.0–36.0)
MCV: 90.7 fL (ref 78.0–100.0)
Platelets: 253 10*3/uL (ref 150–400)
RBC: 3.45 MIL/uL — ABNORMAL LOW (ref 3.87–5.11)
RDW: 12.4 % (ref 11.5–15.5)
WBC: 6.8 10*3/uL (ref 4.0–10.5)

## 2014-09-18 LAB — URINE MICROSCOPIC-ADD ON

## 2014-09-18 MED ORDER — KETOROLAC TROMETHAMINE 60 MG/2ML IM SOLN
60.0000 mg | Freq: Once | INTRAMUSCULAR | Status: AC
  Administered 2014-09-18: 60 mg via INTRAMUSCULAR
  Filled 2014-09-18: qty 2

## 2014-09-18 MED ORDER — GI COCKTAIL ~~LOC~~
30.0000 mL | ORAL | Status: AC
  Administered 2014-09-18: 30 mL via ORAL
  Filled 2014-09-18: qty 30

## 2014-09-18 NOTE — MAU Provider Note (Signed)
History     CSN: [id number]  Arrival date and time: 09/18/14 2218   First Provider Initiated Contact with Patient 09/18/14 2307      No chief complaint on file.  HPI Penny Barber 28 y.o. [id number] post csection on 12/2 presents complaining of pain at surgical site and diarrhea that started 2 hours ago.  She also feels heavy cramping in the uterus 10/10 and decreasing vaginal bleeding.  She is taking her pain medications around the clock but is not having sufficient pain relief.  Started eating regular food today after a week of porridge. After eating, she had abdominal pain and back pain.  History of heartburn.   She had not had a bowel movement in several days.  Someone brought her a juice to help her go to the bathroom.  She subsequently had diarrhea 3-4 times.      OB History    Gravida Para Term Preterm AB TAB SAB Ectopic Multiple Living   3 3 2 1      0 2      Past Medical History  Diagnosis Date  . Medical history non-contributory   . Varicose veins   . Anxiety     when heart skips a beat  . Diabetes mellitus without complication     Past Surgical History  Procedure Laterality Date  . Cesarean section    . Cesarean section N/A 09/10/2014    Procedure: CESAREAN SECTION;  Surgeon: Adam PhenixJames G Arnold, MD;  Location: [facility];  Service: Obstetrics;  Laterality: N/A;    Family History  Problem Relation Age of Onset  . Alcohol abuse Neg Hx   . Arthritis Neg Hx   . Asthma Neg Hx   . Birth defects Neg Hx   . Cancer Neg Hx   . COPD Neg Hx   . Depression Neg Hx   . Diabetes Neg Hx   . Drug abuse Neg Hx   . Early death Neg Hx   . Hearing loss Neg Hx   . Heart disease Neg Hx   . Hyperlipidemia Neg Hx   . Hypertension Neg Hx   . Kidney disease Neg Hx   . Learning disabilities Neg Hx   . Mental illness Neg Hx   . Mental retardation Neg Hx   . Miscarriages / Stillbirths Neg Hx   . Stroke Neg Hx   . Vision loss Neg Hx   . Varicose Veins Neg Hx     History  Substance  Use Topics  . Smoking status: Never Smoker   . Smokeless tobacco: Never Used  . Alcohol Use: No    Allergies: No Known Allergies  Prescriptions prior to admission  Medication Sig Dispense Refill Last Dose  . ibuprofen (ADVIL,MOTRIN) 600 MG tablet Take 1 tablet (600 mg total) by mouth every 6 (six) hours as needed. 50 tablet 1   . oxyCODONE-acetaminophen (PERCOCET/ROXICET) 5-325 MG per tablet Take 1-2 tablets by mouth every 4 (four) hours as needed (for pain scale equal to or greater than 7). 50 tablet 0   . Prenatal Vit-Fe Fumarate-FA (PRENATAL MULTIVITAMIN) TABS tablet Take 1 tablet by mouth daily at 12 noon. 30 tablet 3 09/09/2014 at Unknown time    ROS Pertinent ROS in HPI Physical Exam   Blood pressure 118/71, pulse 85, temperature 98.3 F (36.8 C), temperature source Oral, resp. rate 18, unknown if currently breastfeeding.  Physical Exam  Constitutional: She is oriented to person, place, and time. She appears well-developed and well-nourished. No distress.  HENT:  Head: Normocephalic and atraumatic.  Eyes: EOM are normal.  Neck: Normal range of motion.  Cardiovascular: Normal rate, regular rhythm and normal heart sounds.   Respiratory: Effort normal and breath sounds normal. No respiratory distress.  GI: Soft. She exhibits no distension. There is no tenderness. There is no rebound and no guarding.  csection appears to be well-healing, without erythema, drainage or induration.   Of note - the elastic of her underwear lies directly across her incision.  She is given mesh underwear to replace.  Musculoskeletal: Normal range of motion.  Neurological: She is alert and oriented to person, place, and time.  Skin: Skin is warm and dry.  Psychiatric: She has a normal mood and affect.    MAU Course  Procedures  MDM GI cocktail and IM Toradol 60mg  ordered.  Pt indicates complete relief of all abdominal pain/cramping/burning.  No further instance of diarrhea.  Requesting  discharge No sign of infection.    Assessment and Plan  A:  1. Gastroesophageal reflux disease, esophagitis presence not specified   2. Incisional pain    P: Discharge to home Be certain no pressure on incision Pepcid BID Continue pain medication regimen previously prescribed Diet discussed Rest.  Avoid long periods standing/walking. Urine culture pending Follow up in clinic as needed/scheduled Patient may return to MAU as needed or if her condition were to change or worsen   Bertram Denvereague Clark, Karen E 09/18/2014, 11:08 PM

## 2014-09-18 NOTE — MAU Note (Signed)
Pt presents complaining of sharp shooting pains in her pelvis and back with a pushing sensations that she compares to labor. Pt states the pain started 2 hours ago. Pt breastfeeding but denies problems. Vaginal bleeding has slowed.

## 2014-09-19 DIAGNOSIS — K219 Gastro-esophageal reflux disease without esophagitis: Secondary | ICD-10-CM

## 2014-09-19 DIAGNOSIS — R208 Other disturbances of skin sensation: Secondary | ICD-10-CM

## 2014-09-19 MED ORDER — FAMOTIDINE 20 MG PO TABS: 20.0000 mg | ORAL_TABLET | Freq: Two times a day (BID) | ORAL | Status: DC

## 2014-09-19 NOTE — Discharge Instructions (Signed)
Gastroesophageal Reflux Disease, Adult  Gastroesophageal reflux disease (GERD) happens when acid from your stomach flows up into the esophagus. When acid comes in contact with the esophagus, the acid causes soreness (inflammation) in the esophagus. Over time, GERD may create small holes (ulcers) in the lining of the esophagus.  CAUSES   · Increased body weight. This puts pressure on the stomach, making acid rise from the stomach into the esophagus.  · Smoking. This increases acid production in the stomach.  · Drinking alcohol. This causes decreased pressure in the lower esophageal sphincter (valve or ring of muscle between the esophagus and stomach), allowing acid from the stomach into the esophagus.  · Late evening meals and a full stomach. This increases pressure and acid production in the stomach.  · A malformed lower esophageal sphincter.  Sometimes, no cause is found.  SYMPTOMS   · Burning pain in the lower part of the mid-chest behind the breastbone and in the mid-stomach area. This may occur twice a week or more often.  · Trouble swallowing.  · Sore throat.  · Dry cough.  · Asthma-like symptoms including chest tightness, shortness of breath, or wheezing.  DIAGNOSIS   Your caregiver may be able to diagnose GERD based on your symptoms. In some cases, X-rays and other tests may be done to check for complications or to check the condition of your stomach and esophagus.  TREATMENT   Your caregiver may recommend over-the-counter or prescription medicines to help decrease acid production. Ask your caregiver before starting or adding any new medicines.   HOME CARE INSTRUCTIONS   · Change the factors that you can control. Ask your caregiver for guidance concerning weight loss, quitting smoking, and alcohol consumption.  · Avoid foods and drinks that make your symptoms worse, such as:  ¨ Caffeine or alcoholic drinks.  ¨ Chocolate.  ¨ Peppermint or mint flavorings.  ¨ Garlic and onions.  ¨ Spicy foods.  ¨ Citrus fruits,  such as oranges, lemons, or limes.  ¨ Tomato-based foods such as sauce, chili, salsa, and pizza.  ¨ Fried and fatty foods.  · Avoid lying down for the 3 hours prior to your bedtime or prior to taking a nap.  · Eat small, frequent meals instead of large meals.  · Wear loose-fitting clothing. Do not wear anything tight around your waist that causes pressure on your stomach.  · Raise the head of your bed 6 to 8 inches with wood blocks to help you sleep. Extra pillows will not help.  · Only take over-the-counter or prescription medicines for pain, discomfort, or fever as directed by your caregiver.  · Do not take aspirin, ibuprofen, or other nonsteroidal anti-inflammatory drugs (NSAIDs).  SEEK IMMEDIATE MEDICAL CARE IF:   · You have pain in your arms, neck, jaw, teeth, or back.  · Your pain increases or changes in intensity or duration.  · You develop nausea, vomiting, or sweating (diaphoresis).  · You develop shortness of breath, or you faint.  · Your vomit is green, yellow, black, or looks like coffee grounds or blood.  · Your stool is red, bloody, or black.  These symptoms could be signs of other problems, such as heart disease, gastric bleeding, or esophageal bleeding.  MAKE SURE YOU:   · Understand these instructions.  · Will watch your condition.  · Will get help right away if you are not doing well or get worse.  Document Released: 07/05/2005 Document Revised: 12/18/2011 Document Reviewed: 04/14/2011  ExitCare® Patient   Information ©2015 [employer]. This information is not intended to replace advice given to you by your health care provider. Make sure you discuss any questions you have with your health care provider.  Food Choices for Gastroesophageal Reflux Disease  When you have gastroesophageal reflux disease (GERD), the foods you eat and your eating habits are very important. Choosing the right foods can help ease the discomfort of GERD.  WHAT GENERAL GUIDELINES DO I NEED TO FOLLOW?  · Choose fruits,  vegetables, whole grains, low-fat dairy products, and low-fat meat, fish, and poultry.  · Limit fats such as oils, salad dressings, butter, nuts, and avocado.  · Keep a food diary to identify foods that cause symptoms.  · Avoid foods that cause reflux. These may be different for different people.  · Eat frequent small meals instead of three large meals each day.  · Eat your meals slowly, in a relaxed setting.  · Limit fried foods.  · Cook foods using methods other than frying.  · Avoid drinking alcohol.  · Avoid drinking large amounts of liquids with your meals.  · Avoid bending over or lying down until 2-3 hours after eating.  WHAT FOODS ARE NOT RECOMMENDED?  The following are some foods and drinks that may worsen your symptoms:  Vegetables  Tomatoes. Tomato juice. Tomato and spaghetti sauce. Chili peppers. Onion and garlic. Horseradish.  Fruits  Oranges, grapefruit, and lemon (fruit and juice).  Meats  High-fat meats, fish, and poultry. This includes hot dogs, ribs, ham, sausage, salami, and bacon.  Dairy  Whole milk and chocolate milk. Sour cream. Cream. Butter. Ice cream. Cream cheese.   Beverages  Coffee and tea, with or without caffeine. Carbonated beverages or energy drinks.  Condiments  Hot sauce. Barbecue sauce.   Sweets/Desserts  Chocolate and cocoa. Donuts. Peppermint and spearmint.  Fats and Oils  High-fat foods, including French fries and potato chips.  Other  Vinegar. Strong spices, such as black pepper, white pepper, red pepper, cayenne, curry powder, cloves, ginger, and chili powder.  The items listed above may not be a complete list of foods and beverages to avoid. Contact your dietitian for more information.  Document Released: 09/25/2005 Document Revised: 09/30/2013 Document Reviewed: 07/30/2013  ExitCare® Patient Information ©2015 [employer]. This information is not intended to replace advice given to you by your health care provider. Make sure you discuss any questions you have with your  health care provider.

## 2014-09-20 LAB — URINE CULTURE

## 2014-10-22 ENCOUNTER — Ambulatory Visit (INDEPENDENT_AMBULATORY_CARE_PROVIDER_SITE_OTHER): Payer: Medicaid Other | Admitting: Obstetrics & Gynecology

## 2014-10-22 ENCOUNTER — Encounter: Payer: Self-pay | Admitting: Obstetrics & Gynecology

## 2014-10-22 DIAGNOSIS — Z8632 Personal history of gestational diabetes: Secondary | ICD-10-CM

## 2014-10-22 NOTE — Progress Notes (Signed)
Patient ID: Penny Barber, female   DOB: 08/01/1986, 29 y.o.   MRN: [id number] Subjective:     Penny RusselYiftusira Barber is a 29 y.o. female who presents for a postpartum visit. She is 6 weeks postpartum following a low cervical transverse Cesarean section. I have fully reviewed the prenatal and intrapartum course. The delivery was at 38 gestational weeks. Outcome: primary cesarean section, low transverse incision. Anesthesia: spinal. Postpartum course has been uncomplicated. Baby's course has been complicated with emesis after feeds.  The infant is scheduled to have  A study done today to eval.   Baby is feeding by breast. Bleeding no bleeding. Bowel function is normal. Bladder function is normal. Patient is not sexually active. Contraception method is none. Pt reports that she 'never gets pregnant while breastfeeding and insists that she cannot get pregnant while breastfeeding. Postpartum depression screening: negative.  The following portions of the patient's history were reviewed and updated as appropriate: allergies, current medications, past family history, past medical history, past social history, past surgical history and problem list.  Review of Systems A comprehensive review of systems was negative.   Objective:    BP 117/68 mmHg  Pulse 64  Ht 5' 4.96" (1.65 m)  Wt 184 lb 12.8 oz (83.825 kg)  BMI 30.79 kg/m2  Breastfeeding? Yes  General:  alert and no distress           Abdomen: soft, non-tender; bowel sounds normal; no masses,  no organomegaly and incision clean, dry and intact                          Assessment:     6 weeks postpartum exam. Pap smear not done at today's visit.   H/o GDM   Plan:    1. Contraception: none- declines 2.  Follow up in: 1 year or sooner prn or as needed.   3. Pt to f/u for 2 hour GTT

## 2014-10-22 NOTE — Patient Instructions (Signed)
Gestational Diabetes Mellitus Gestational diabetes mellitus, often simply referred to as gestational diabetes, is a type of diabetes that some women develop during pregnancy. In gestational diabetes, the pancreas does not make enough insulin (a hormone), the cells are less responsive to the insulin that is made (insulin resistance), or both.Normally, insulin moves sugars from food into the tissue cells. The tissue cells use the sugars for energy. The lack of insulin or the lack of normal response to insulin causes excess sugars to build up in the blood instead of going into the tissue cells. As a result, high blood sugar (hyperglycemia) develops. The effect of high sugar (glucose) levels can cause many problems.  RISK FACTORS You have an increased chance of developing gestational diabetes if you have a family history of diabetes and also have one or more of the following risk factors:  A body mass index over 30 (obesity).  A previous pregnancy with gestational diabetes.  An older age at the time of pregnancy. If blood glucose levels are kept in the normal range during pregnancy, women can have a healthy pregnancy. If your blood glucose levels are not well controlled, there may be risks to you, your unborn baby (fetus), your labor and delivery, or your newborn baby.  SYMPTOMS  If symptoms are experienced, they are much like symptoms you would normally expect during pregnancy. The symptoms of gestational diabetes include:   Increased thirst (polydipsia).  Increased urination (polyuria).  Increased urination during the night (nocturia).  Weight loss. This weight loss may be rapid.  Frequent, recurring infections.  Tiredness (fatigue).  Weakness.  Vision changes, such as blurred vision.  Fruity smell to your breath.  Abdominal pain. DIAGNOSIS Diabetes is diagnosed when blood glucose levels are increased. Your blood glucose level may be checked by one or more of the following blood  tests:  A fasting blood glucose test. You will not be allowed to eat for at least 8 hours before a blood sample is taken.  A random blood glucose test. Your blood glucose is checked at any time of the day regardless of when you ate.  A hemoglobin A1c blood glucose test. A hemoglobin A1c test provides information about blood glucose control over the previous 3 months.  An oral glucose tolerance test (OGTT). Your blood glucose is measured after you have not eaten (fasted) for 1-3 hours and then after you drink a glucose-containing beverage. Since the hormones that cause insulin resistance are highest at about 24-28 weeks of a pregnancy, an OGTT is usually performed during that time. If you have risk factors for gestational diabetes, your health care provider may test you for gestational diabetes earlier than 24 weeks of pregnancy. TREATMENT   You will need to take diabetes medicine or insulin daily to keep blood glucose levels in the desired range.  You will need to match insulin dosing with exercise and healthy food choices. The treatment goal is to maintain the before-meal (preprandial), bedtime, and overnight blood glucose level at 60-99 mg/dL during pregnancy. The treatment goal is to further maintain peak after-meal blood sugar (postprandial glucose) level at 100-140 mg/dL. HOME CARE INSTRUCTIONS   Have your hemoglobin A1c level checked twice a year.  Perform daily blood glucose monitoring as directed by your health care provider. It is common to perform frequent blood glucose monitoring.  Monitor urine ketones when you are ill and as directed by your health care provider.  Take your diabetes medicine and insulin as directed by your health care provider   to maintain your blood glucose level in the desired range.  Never run out of diabetes medicine or insulin. It is needed every day.  Adjust insulin based on your intake of carbohydrates. Carbohydrates can raise blood glucose levels but  need to be included in your diet. Carbohydrates provide vitamins, minerals, and fiber which are an essential part of a healthy diet. Carbohydrates are found in fruits, vegetables, whole grains, dairy products, legumes, and foods containing added sugars.  Eat healthy foods. Alternate 3 meals with 3 snacks.  Maintain a healthy weight gain. The usual total expected weight gain varies according to your prepregnancy body mass index (BMI).  Carry a medical alert card or wear your medical alert jewelry.  Carry a 15-gram carbohydrate snack with you at all times to treat low blood glucose (hypoglycemia). Some examples of 15-gram carbohydrate snacks include:  Glucose tablets, 3 or 4.  Glucose gel, 15-gram tube.  Raisins, 2 tablespoons (24 g).  Jelly beans, 6.  Animal crackers, 8.  Fruit juice, regular soda, or low-fat milk, 4 ounces (120 mL).  Gummy treats, 9.  Recognize hypoglycemia. Hypoglycemia during pregnancy occurs with blood glucose levels of 60 mg/dL and below. The risk for hypoglycemia increases when fasting or skipping meals, during or after intense exercise, and during sleep. Hypoglycemia symptoms can include:  Tremors or shakes.  Decreased ability to concentrate.  Sweating.  Increased heart rate.  Headache.  Dry mouth.  Hunger.  Irritability.  Anxiety.  Restless sleep.  Altered speech or coordination.  Confusion.  Treat hypoglycemia promptly. If you are alert and able to safely swallow, follow the 15:15 rule:  Take 15-20 grams of rapid-acting glucose or carbohydrate. Rapid-acting options include glucose gel, glucose tablets, or 4 ounces (120 mL) of fruit juice, regular soda, or low-fat milk.  Check your blood glucose level 15 minutes after taking the glucose.  Take 15-20 grams more of glucose if the repeat blood glucose level is still 70 mg/dL or below.  Eat a meal or snack within 1 hour once blood glucose levels return to normal.  Be alert to polyuria  (excess urination) and polydipsia (excess thirst) which are early signs of hyperglycemia. An early awareness of hyperglycemia allows for prompt treatment. Treat hyperglycemia as directed by your health care provider.  Engage in at least 30 minutes of physical activity a day or as directed by your health care provider. Ten minutes of physical activity timed 30 minutes after each meal is encouraged to control postprandial blood glucose levels.  Adjust your insulin dosing and food intake as needed if you start a new exercise or sport.  Follow your sick-day plan at any time you are unable to eat or drink as usual.  Avoid tobacco and alcohol use.  Keep all follow-up visits as directed by your health care provider.  Follow the advice of your health care provider regarding your prenatal and post-delivery (postpartum) appointments, meal planning, exercise, medicines, vitamins, blood tests, other medical tests, and physical activities.  Perform daily skin and foot care. Examine your skin and feet daily for cuts, bruises, redness, nail problems, bleeding, blisters, or sores.  Brush your teeth and gums at least twice a day and floss at least once a day. Follow up with your dentist regularly.  Schedule an eye exam during the first trimester of your pregnancy or as directed by your health care provider.  Share your diabetes management plan with your workplace or school.  Stay up-to-date with immunizations.  Learn to manage stress.    Obtain ongoing diabetes education and support as needed.  Learn about and consider breastfeeding your baby.  You should have your blood sugar level checked 6-12 weeks after delivery. This is done with an oral glucose tolerance test (OGTT). SEEK MEDICAL CARE IF:   You are unable to eat food or drink fluids for more than 6 hours.  You have nausea and vomiting for more than 6 hours.  You have a blood glucose level of 200 mg/dL and you have ketones in your  urine.  There is a change in mental status.  You develop vision problems.  You have a persistent headache.  You have upper abdominal pain or discomfort.  You develop an additional serious illness.  You have diarrhea for more than 6 hours.  You have been sick or have had a fever for a couple of days and are not getting better. SEEK IMMEDIATE MEDICAL CARE IF:   You have difficulty breathing.  You no longer feel the baby moving.  You are bleeding or have discharge from your vagina.  You start having premature contractions or labor. MAKE SURE YOU:  Understand these instructions.  Will watch your condition.  Will get help right away if you are not doing well or get worse. Document Released: 01/01/2001 Document Revised: 02/09/2014 Document Reviewed: 04/23/2012 Exit[employer] Patient Information 2015 [employer]. This information is not intended to replace advice given to you by your health care provider. Make sure you discuss any questions you have with your health care provider.  

## 2014-11-03 ENCOUNTER — Other Ambulatory Visit: Payer: Medicaid Other

## 2014-11-04 ENCOUNTER — Encounter: Payer: Self-pay | Admitting: Obstetrics & Gynecology

## 2014-11-04 ENCOUNTER — Other Ambulatory Visit: Payer: Medicaid Other

## 2014-11-04 DIAGNOSIS — O34219 Maternal care for unspecified type scar from previous cesarean delivery: Secondary | ICD-10-CM

## 2014-11-04 DIAGNOSIS — O24429 Gestational diabetes mellitus in childbirth, unspecified control: Secondary | ICD-10-CM

## 2014-11-05 LAB — GLUCOSE TOLERANCE, 2 HOURS
GLUCOSE, 2 HOUR: 103 mg/dL (ref 70–139)
Glucose, Fasting: 80 mg/dL (ref 70–99)

## 2014-11-09 ENCOUNTER — Ambulatory Visit: Payer: Medicaid Other | Attending: Internal Medicine | Admitting: Physician Assistant

## 2014-11-09 ENCOUNTER — Encounter: Payer: Self-pay | Admitting: Physician Assistant

## 2014-11-09 VITALS — BP 109/73 | HR 104 | Temp 98.2°F | Resp 18 | Wt 183.4 lb

## 2014-11-09 DIAGNOSIS — N61 Inflammatory disorders of breast: Secondary | ICD-10-CM

## 2014-11-09 MED ORDER — IBUPROFEN 600 MG PO TABS: 600.0000 mg | ORAL_TABLET | Freq: Four times a day (QID) | ORAL | Status: DC | PRN

## 2014-11-09 MED ORDER — AMOXICILLIN 500 MG PO CAPS: 500.0000 mg | ORAL_CAPSULE | Freq: Three times a day (TID) | ORAL | Status: DC

## 2014-11-09 NOTE — Progress Notes (Signed)
Chief Complaint: My left breast hurts  Subjective: 29 year old female diabetic presents 6-8 weeks postpartum with pain in her left breast. She is lactating. She last nursed her baby 12 hours ago on the left. She's had extensive pain for the last 2-3 days in the left breast. She's had some redness and the sensation of heat to that area as well. She can barely touch her breasts without pain.   ROS:  GEN: denies fever or chills, denies change in weight Skin: denies lesions or rashes HEENT: denies headache, earache, epistaxis, sore throat, or neck pain LUNGS: denies SHOB, dyspnea, PND, orthopnea CV: denies CP or palpitations ABD: denies abd pain, N or V EXT: denies muscle spasms or swelling; no pain in lower ext, no weakness NEURO: denies numbness or tingling, denies sz, stroke or TIA   Objective:  Filed Vitals:   11/09/14 0936  BP: 109/73  Pulse: 104  Temp: 98.2 F (36.8 C)  TempSrc: Oral  Resp: 18  Weight: 183 lb 6.4 oz (83.19 kg)  SpO2: 99%    Physical Exam:  General: in no acute distress. HEENT: no pallor, no icterus, moist oral mucosa, no JVD, no lymphadenopathy Heart: Normal  s1 &s2  Regular rate and rhythm, without murmurs, rubs, gallops. Lungs: Clear to auscultation bilaterally. Left breast: Edematous and erythematous, no mass, no drainage  Pertinent Lab Results: None   Medications: Prior to Admission medications   Medication Sig Start Date End Date Taking? Authorizing Provider  amoxicillin (AMOXIL) 500 MG capsule Take 1 capsule (500 mg total) by mouth 3 (three) times daily. 11/09/14   Vivianne Masteriffany S Paije Goodhart, PA-C  famotidine (PEPCID) 20 MG tablet Take 1 tablet (20 mg total) by mouth 2 (two) times daily. 09/19/14   Bertram DenverKaren E Teague Clark, PA-C  ibuprofen (ADVIL,MOTRIN) 600 MG tablet Take 1 tablet (600 mg total) by mouth every 6 (six) hours as needed. 11/09/14   Vivianne Masteriffany S Uriah Philipson, PA-C  oxyCODONE-acetaminophen (PERCOCET/ROXICET) 5-325 MG per tablet Take 1-2 tablets by mouth every 4  (four) hours as needed (for pain scale equal to or greater than 7). 09/12/14   Arabella MerlesKimberly D Shaw, CNM  Prenatal Vit-Fe Fumarate-FA (PRENATAL MULTIVITAMIN) TABS tablet Take 1 tablet by mouth daily at 12 noon. 05/29/14   Doris Cheadleeepak Advani, MD    Assessment: 1. Left breast mastitis  Plan: Amoxicillin 3 times daily for 10 days Continue to breast-feed as tolerated Anti-inflammatories as needed Ice packs 3 times daily  Follow up: One month  The patient was given clear instructions to go to ER or return to medical center if symptoms don't improve, worsen or new problems develop. The patient verbalized understanding. The patient was told to call to get lab results if they haven't heard anything in the next week.   This note has been created with Education officer, environmentalDragon speech recognition software and smart phrase technology. Any transcriptional errors are unintentional.   Scot Juniffany Savvas Roper, PA-C 11/09/2014, 10:53 AM

## 2014-11-09 NOTE — Progress Notes (Signed)
Pt presents to clinic with c/o left breast pain/swelling since yesterday. No redness noted.

## 2014-11-09 NOTE — Patient Instructions (Signed)
Ice pack to left breast as needed Antinflammatories as needed Antibiotic 3X/day for 10 days Return in 1 month with Dr. Orpah CobbAdvani to discuss toes

## 2014-12-10 ENCOUNTER — Encounter: Payer: Self-pay | Admitting: Obstetrics & Gynecology

## 2015-03-08 IMAGING — US US OB COMP +14 WK
1 series · 12 of 28 positions shown · non-contrast
Comparison: none

[Series 1: us ob comp +14 wk · 12 of 73 slices shown]
[im 3/73]
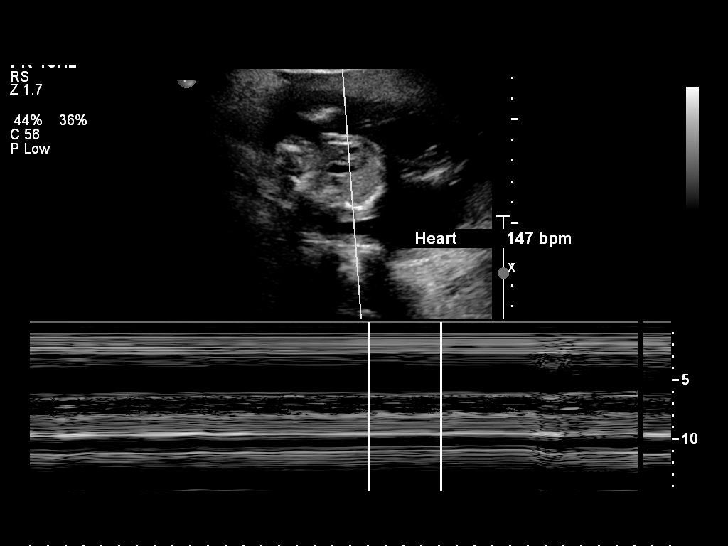
[im 9/73]
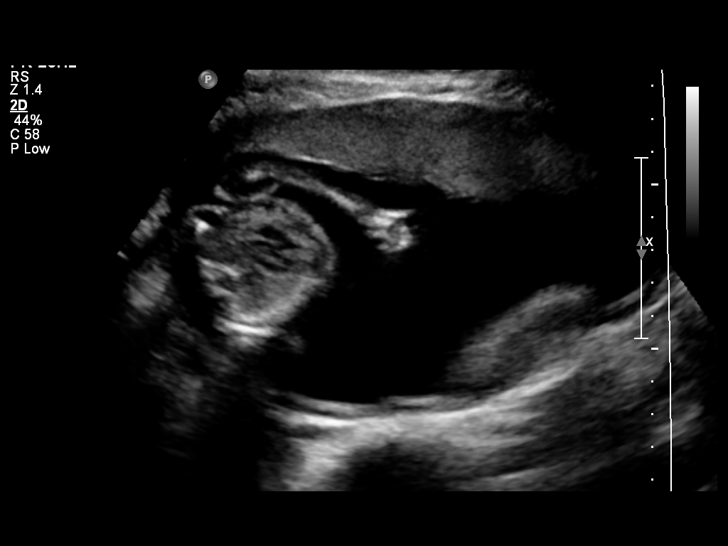
[im 14/73]
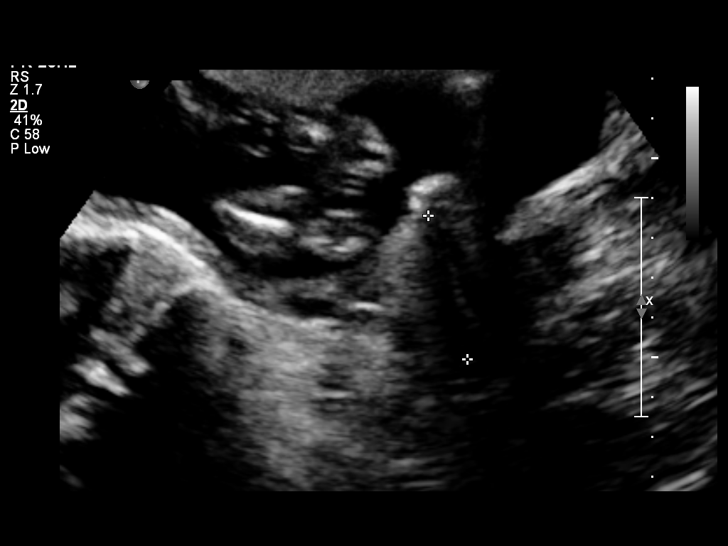
[im 22/73]
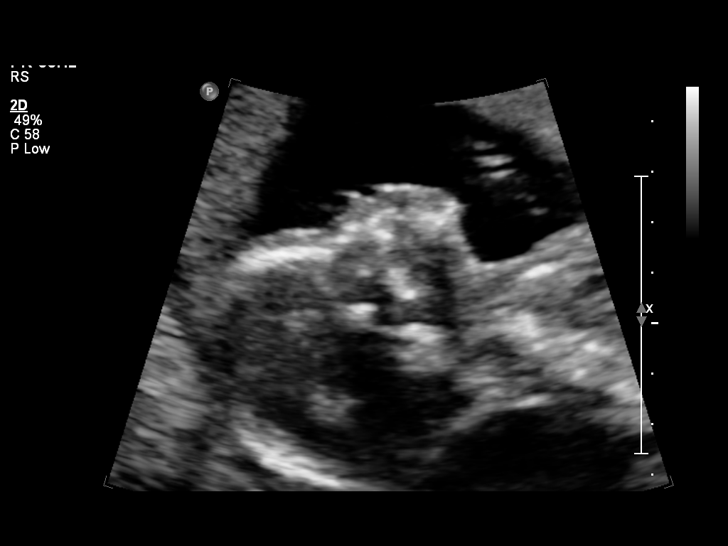
[im 27/73]
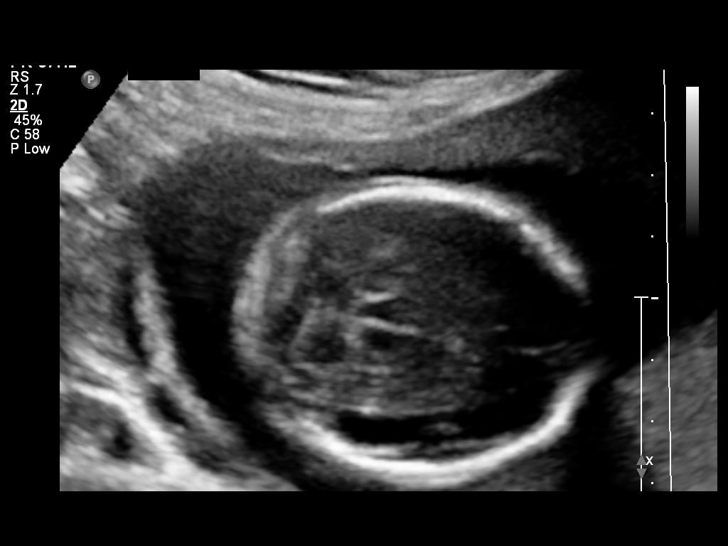
[im 33/73]
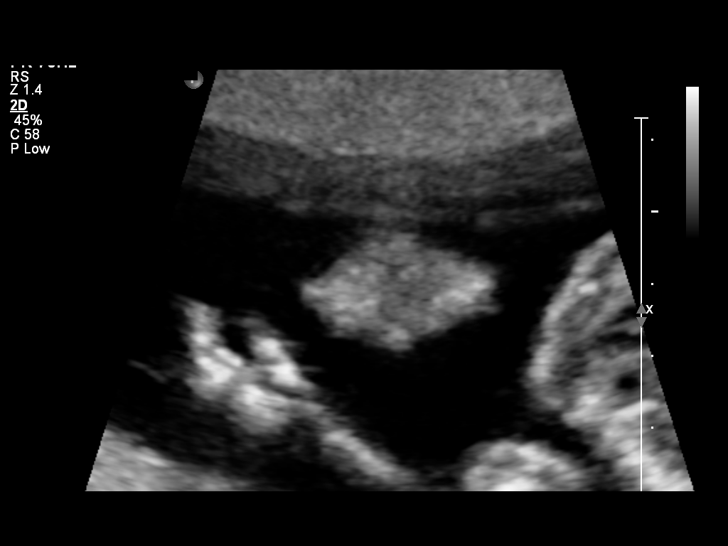
[im 41/73]
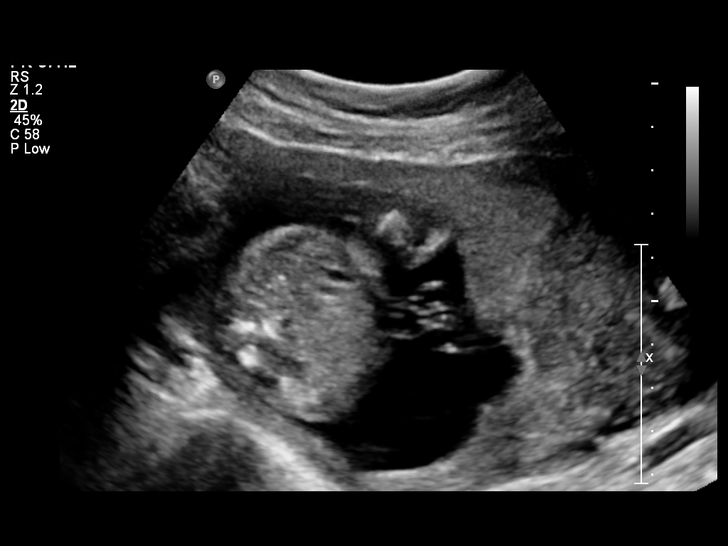
[im 46/73]
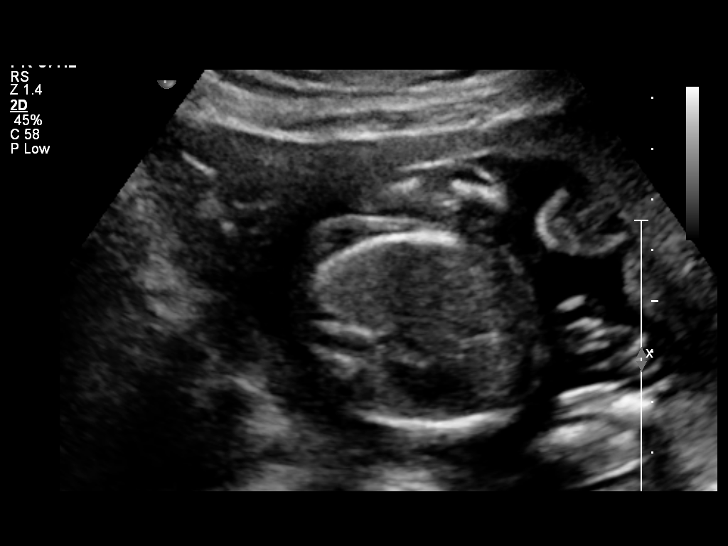
[im 51/73]
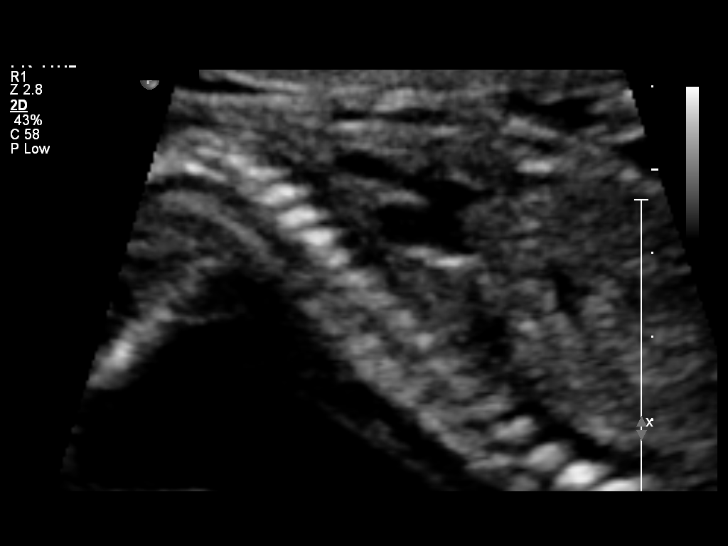
[im 59/73]
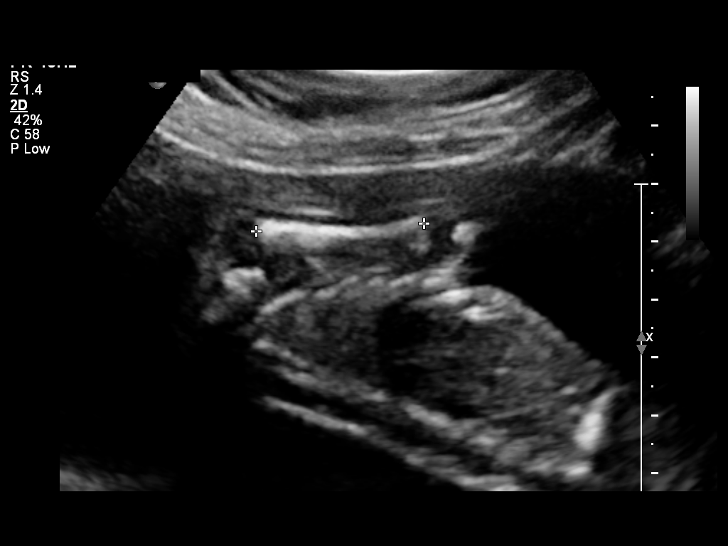
[im 65/73]
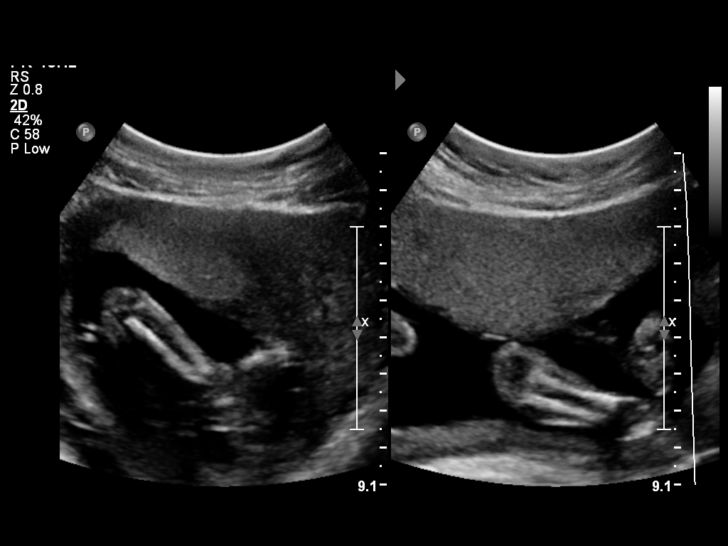
[im 70/73]
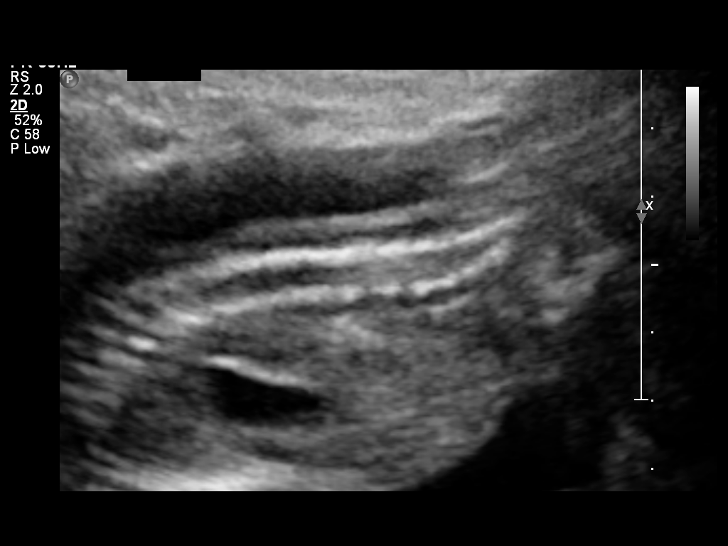

[12 of 28 positions shown; findings below may reference images not displayed]

OBSTETRICS REPORT
                      (Signed Final 05/06/2014 [DATE])

Service(s) Provided

 US OB COMP + 14 WK                                    76805.1
Indications

 Basic anatomic survey
 Poor obstetric history: Previous preterm delivery
 (86w5d)
 Previous cesarean section
Fetal Evaluation

 Num Of Fetuses:    1
 Fetal Heart Rate:  147                          bpm
 Cardiac Activity:  Observed
 Presentation:      Variable
 Placenta:          Anterior, above cervical os
 P. Cord            Visualized, central
 Insertion:

 Amniotic Fluid
 AFI FV:      Subjectively within normal limits
                                             Larg Pckt:     5.4  cm
Biometry

 BPD:     45.4  mm     G. Age:  19w 5d                CI:        78.14   70 - 86
                                                      FL/HC:      18.5   16.8 -

 HC:     162.5  mm     G. Age:  19w 0d        8  %    HC/AC:      1.15   1.09 -

 AC:       141  mm     G. Age:  19w 4d       28  %    FL/BPD:
 FL:        30  mm     G. Age:  19w 2d       19  %    FL/AC:      21.3   20 - 24
 HUM:       30  mm     G. Age:  19w 6d       50  %
 NFT:     4.63  mm

 Est. FW:     288  gm    0 lb 10 oz      37  %
Gestational Age

 LMP:           18w 4d        Date:  12/27/13                 EDD:   10/03/14
 U/S Today:     19w 3d                                        EDD:   09/27/14
 Best:          20w 0d     Det. By:  Early Ultrasound         EDD:   09/23/14
Anatomy
 Cranium:          Appears normal         Aortic Arch:      Appears normal
 Fetal Cavum:      Appears normal         Ductal Arch:      Appears normal
 Ventricles:       Appears normal         Diaphragm:        Appears normal
 Choroid Plexus:   Appears normal         Stomach:          Appears normal, left
                                                            sided
 Cerebellum:       Appears normal         Abdomen:          Appears normal
 Posterior Fossa:  Appears normal         Abdominal Wall:   Appears nml (cord
                                                            insert, abd wall)
 Nuchal Fold:      Appears normal         Cord Vessels:     Appears normal (3
                                                            vessel cord)
 Face:             Orbits appear          Kidneys:          Appear normal
                   normal
 Lips:             Appears normal         Bladder:          Appears normal
 Heart:            Not well visualized    Spine:            Ltd views no
                                                            intracranial signs of
                                                            NTD
 RVOT:             Not well visualized    Lower             Appears normal
                                          Extremities:
 LVOT:             Not well visualized    Upper             Appears normal
                                          Extremities:

 Other:  Fetus appears to be a female. Technically difficult due to fetal position.
Targeted Anatomy

 Fetal Central Nervous System
 Lat. Ventricles:  4.9                    Cisterna Magna:
Cervix Uterus Adnexa

 Cervical Length:    3.7      cm

 Cervix:       Normal appearance by transabdominal scan.
 Left Ovary:    Within normal limits.
 Right Ovary:   Within normal limits.

 Adnexa:     No abnormality visualized.
Impression

 SIUP at 82w2d
 EFW 37th%
 No structural defects, limitations as detailed above
 No previa
Recommendations

 Recommend follow up attempt to complete fetal survey in 6-8
 weeks.

 questions or concerns.

## 2015-04-20 IMAGING — US US OB FOLLOW-UP
3 series · 12 of 28 positions shown · non-contrast
Comparison: none

[Series 1: us ob follow up · 2 of 10 slices shown (1 of 3)]
[im 4/10]
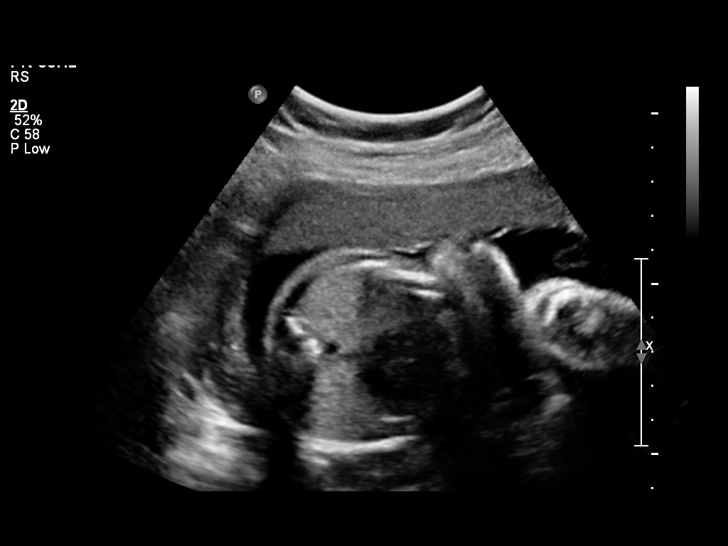
[im 10/10]
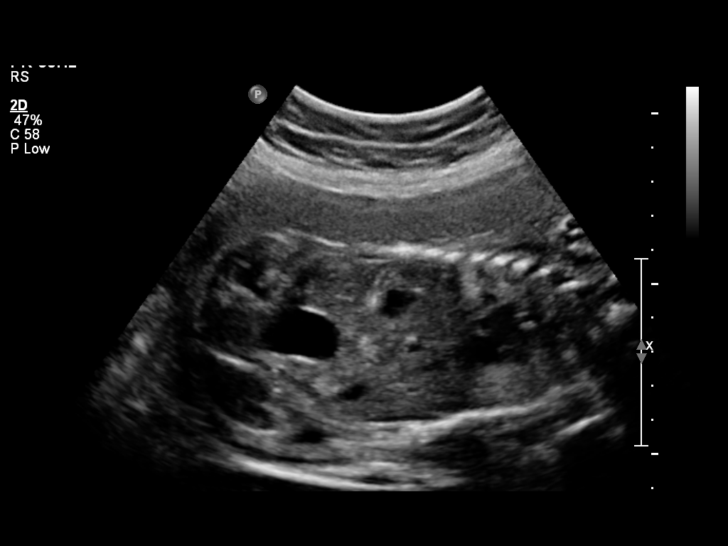

[Series 1: us ob follow up · 2 of 14 slices shown (2 of 3)]
[im 4/14]
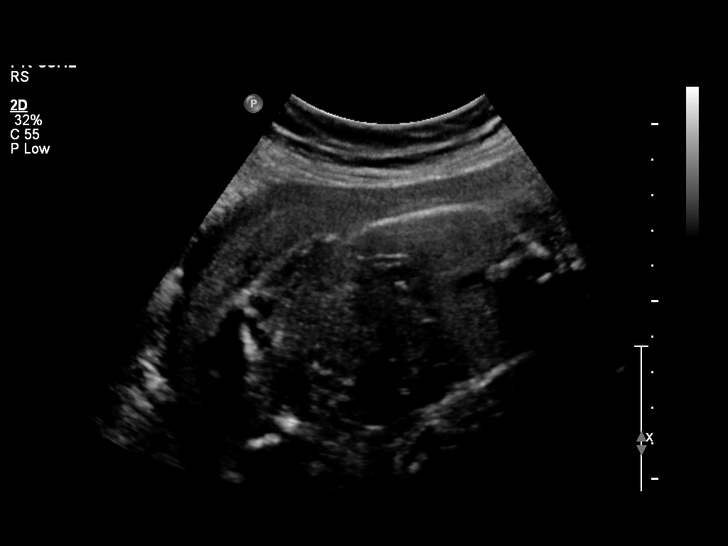
[im 14/14]
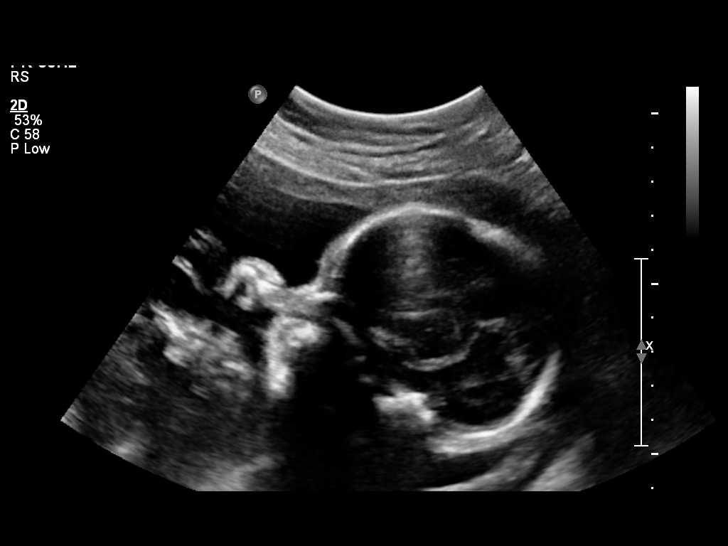

[Series 1: us ob follow up · 8 of 53 slices shown (3 of 3)]
[im 3/53]
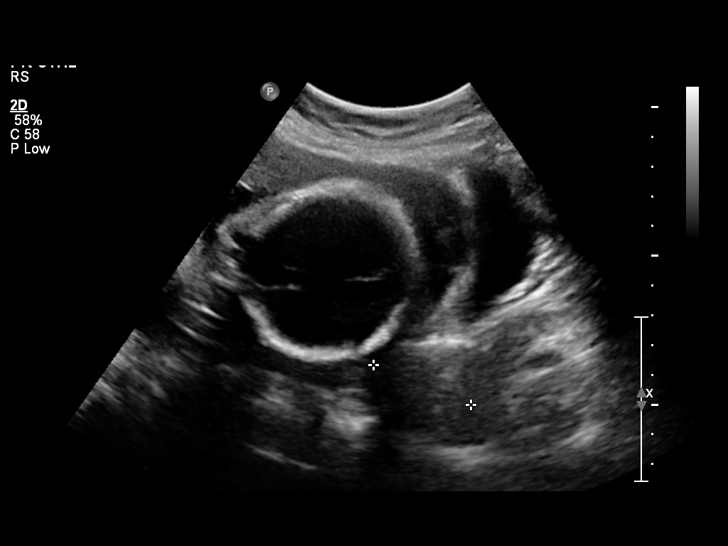
[im 9/53]
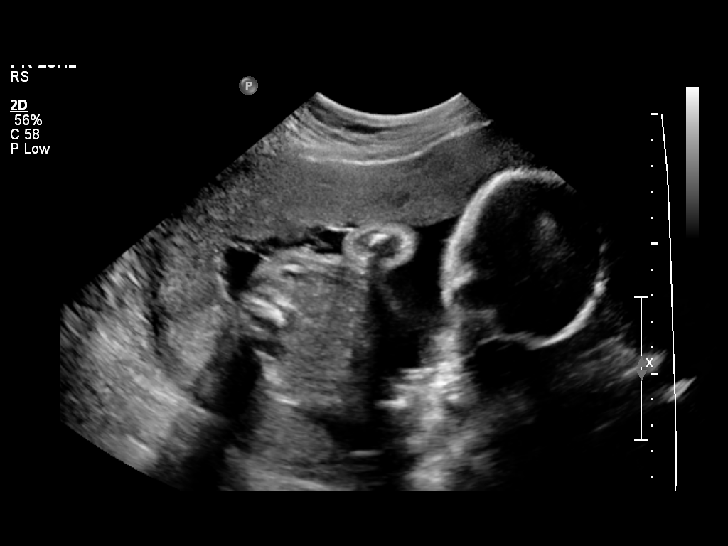
[im 18/53]
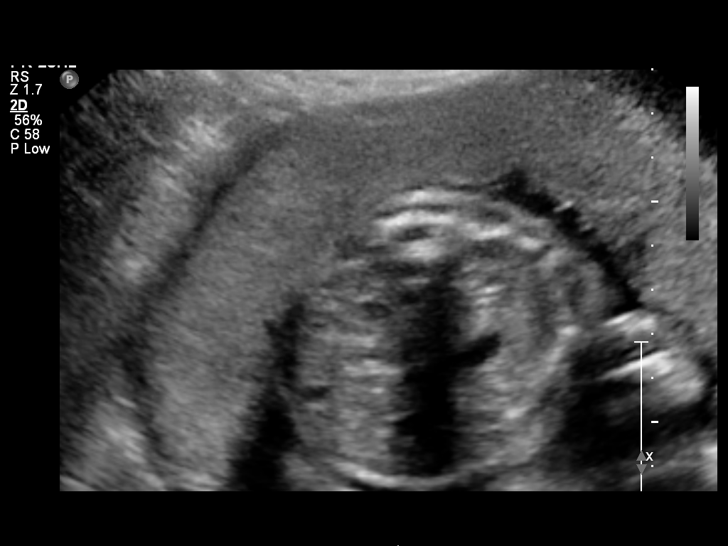
[im 24/53]
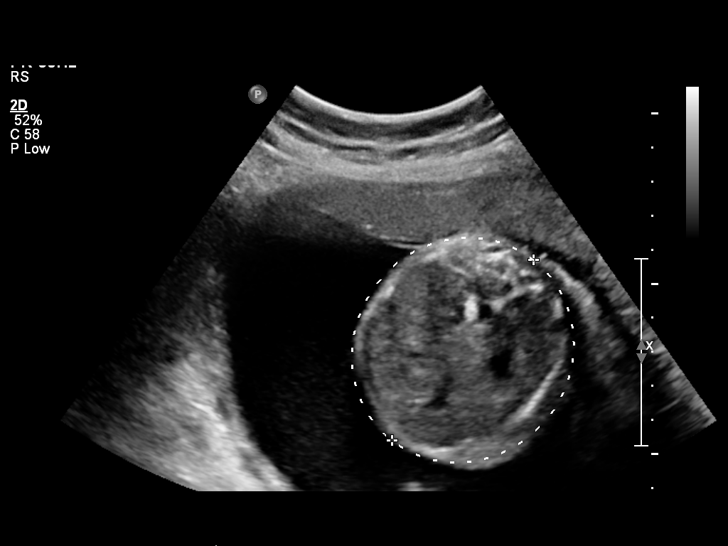
[im 29/53]
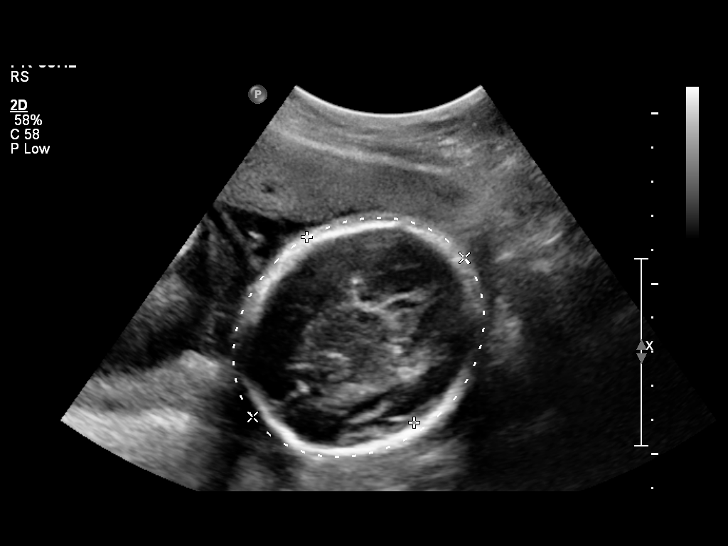
[im 38/53]
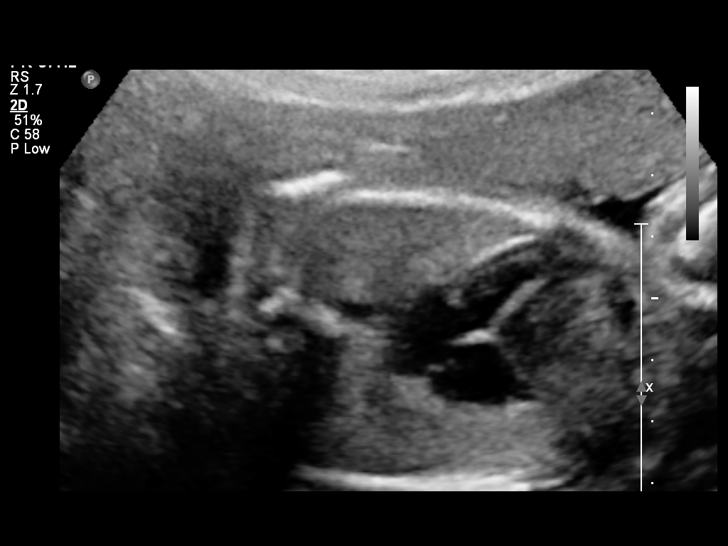
[im 44/53]
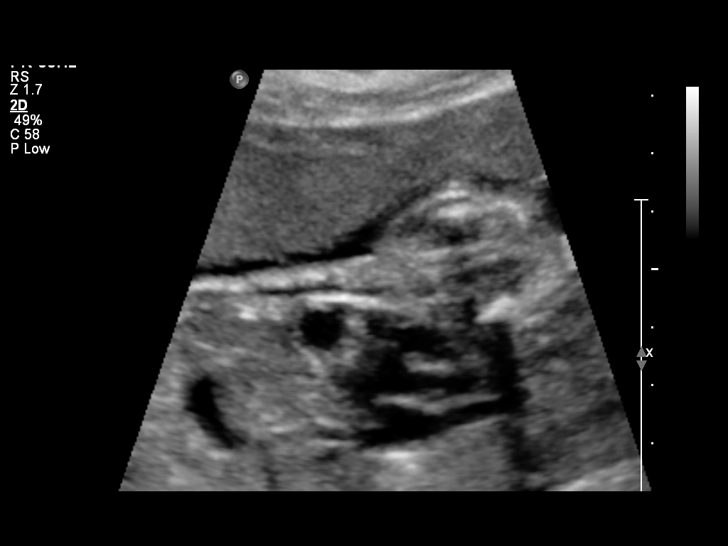
[im 50/53]
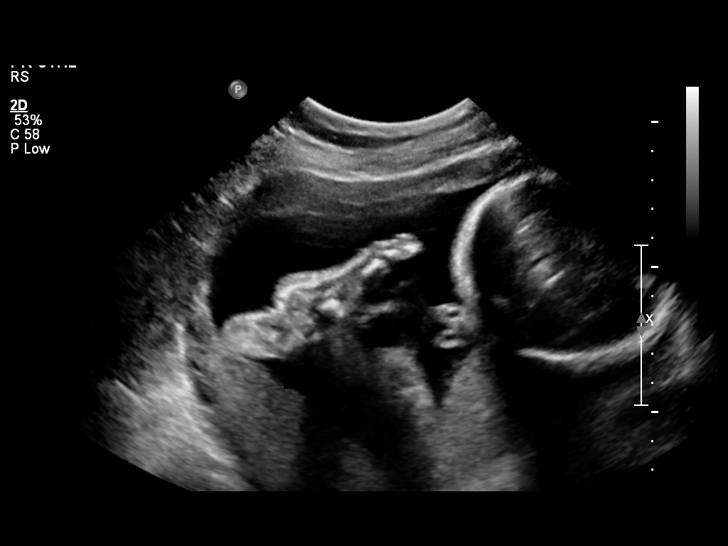

[12 of 28 positions shown; findings below may reference images not displayed]

OBSTETRICS REPORT
                      (Signed Final 06/18/2014 [DATE])

Service(s) Provided

 US OB FOLLOW UP                                       76816.1
Indications

 Follow-up incomplete fetal anatomic evaluation
 Poor obstetric history: Previous preterm delivery
 (24w9d)
 Previous cesarean section
Fetal Evaluation

 Num Of Fetuses:    1
 Fetal Heart Rate:  155                          bpm
 Cardiac Activity:  Observed
 Presentation:      Cephalic
 Placenta:          Anterior, above cervical os
 P. Cord            Visualized
 Insertion:

 Amniotic Fluid
 AFI FV:      Subjectively within normal limits
                                             Larg Pckt:     5.9  cm
Biometry

 BPD:     63.3  mm     G. Age:  25w 4d                CI:        74.27   70 - 86
                                                      FL/HC:      21.3   18.6 -

 HC:     233.2  mm     G. Age:  25w 2d        8  %    HC/AC:      1.10   1.04 -

 AC:     212.7  mm     G. Age:  25w 6d       31  %    FL/BPD:     78.4   71 - 87
 FL:      49.6  mm     G. Age:  26w 5d       54  %    FL/AC:      23.3   20 - 24
 Est. FW:     888  gm    1 lb 15 oz      53  %
Gestational Age

 LMP:           24w 5d        Date:  12/27/13                 EDD:   10/03/14
 U/S Today:     25w 6d                                        EDD:   09/25/14
 Best:          26w 1d     Det. By:  Early Ultrasound         EDD:   09/23/14
Anatomy

 Cranium:          Appears normal         Aortic Arch:      Previously seen
 Fetal Cavum:      Appears normal         Ductal Arch:      Previously seen
 Ventricles:       Appears normal         Diaphragm:        Appears normal
 Choroid Plexus:   Previously seen        Stomach:          Appears normal, left
                                                            sided
 Cerebellum:       Previously seen        Abdomen:          Appears normal
 Posterior Fossa:  Previously seen        Abdominal Wall:   Previously seen
 Nuchal Fold:      Previously seen        Cord Vessels:     Appears normal (3
                                                            vessel cord)
 Face:             Profile appears        Kidneys:          Appear normal
                   normal
 Lips:             Previously seen        Bladder:          Appears normal
 Heart:            Appears normal         Spine:            Appears normal
                   (4CH, axis, and
                   situs)
 RVOT:             Appears normal         Lower             Previously seen
                                          Extremities:
 LVOT:             Appears normal         Upper             Previously seen
                                          Extremities:

 Other:  Fetus appears to be a female. Technically difficult due to fetal position.
Cervix Uterus Adnexa

 Cervical Length:    3.5      cm

 Cervix:       Normal appearance by transabdominal scan.

 Left Ovary:    Size(cm) L: 3.43 x W: 2.19 x H: 1.67  Volume(cc):
 Right Ovary:   Size(cm) L: 2.7 x W: 2.54 x H: 1.68  Volume(cc): 6
Impression

 Single IUP at 26w 1d
 Normal interval anatomy.  The anatomic survey is now
 complete
 Fetal growth is appropriate (53rd %tile)
 Anterior placenta without previa
 Normal amniotic fluid volume
Recommendations

 Follow-up ultrasounds as clinically indicated.

 questions or concerns.

## 2017-10-11 ENCOUNTER — Ambulatory Visit: Payer: Self-pay | Attending: Internal Medicine | Admitting: Physician Assistant

## 2017-10-11 ENCOUNTER — Other Ambulatory Visit: Payer: Self-pay

## 2017-10-11 VITALS — BP 125/86 | HR 75 | Temp 97.4°F | Resp 12 | Wt 192.6 lb

## 2017-10-11 DIAGNOSIS — I8390 Asymptomatic varicose veins of unspecified lower extremity: Secondary | ICD-10-CM | POA: Insufficient documentation

## 2017-10-11 DIAGNOSIS — R109 Unspecified abdominal pain: Secondary | ICD-10-CM | POA: Insufficient documentation

## 2017-10-11 DIAGNOSIS — F419 Anxiety disorder, unspecified: Secondary | ICD-10-CM | POA: Insufficient documentation

## 2017-10-11 DIAGNOSIS — E119 Type 2 diabetes mellitus without complications: Secondary | ICD-10-CM | POA: Insufficient documentation

## 2017-10-11 DIAGNOSIS — R42 Dizziness and giddiness: Secondary | ICD-10-CM | POA: Insufficient documentation

## 2017-10-11 LAB — GLUCOSE, POCT (MANUAL RESULT ENTRY): POC Glucose: 115 mg/dL — AB (ref 70–99)

## 2017-10-11 NOTE — Addendum Note (Signed)
Addended by: Guy FrancoBENJAMIN, Saron Vanorman on: 10/11/2017 12:43 PM   Modules accepted: Orders

## 2017-10-11 NOTE — Progress Notes (Signed)
"  Need check on blood sugar"

## 2017-10-11 NOTE — Patient Instructions (Addendum)
Aim for 30 minutes of exercise most days. Rethink what you drink. Water is great! Aim for 2-3 Carb Choices per meal (30-45 grams) +/- 1 either way  Aim for 0-15 Carbs per snack if hungry  Include protein in moderation with your meals and snacks  Consider reading food labels for Total Carbohydrate and Fat Grams of foods  Consider checking BG at alternate times per day  Continue taking medication as directed Be mindful about how much sugar you are adding to beverages and other foods. Fruit Punch - find one with no sugar  Measure and decrease portions of carbohydrate foods  Make your plate and don't go back for seconds   Keep track/log your blood sugars twice daily and bring the numbers to your next appointment

## 2017-10-11 NOTE — Progress Notes (Signed)
Penny Barber  [id number]  [id number]  DOB - 12/01/1985  Chief Complaint  Patient presents with  . Hospitalization Follow-up    urgent care and ED [place]       Subjective:   Penny RusselYiftusira January is a 32 y.o. female here today for a hospital in urgent care follow-up. She went to the emergency department [place] on 09/18/2017 with complaints of dizziness and abdominal pain. They noted her blood sugar to be elevated. She was hydrated and sent home. Initially she felt better. Later she developed recurrent symptoms and went to the local urgent care. This time her blood sugar was 270. An A1c was 7%. Her urine was negative. She again was hydrated and placed on some symptom relief with meclizine and Bentyl. Later they called her back and told her about her blood sugars and how she needed to see her primary care provider because they don't prescribe medications for folks that they will not be following.  Since then she's been checking her blood sugar twice a day. Her ranges from 80-160. She is walking more. She's trying to stay off medications. Her abdominal pain has subsided. Her dizziness has subsided. She's feeling much better overall. Nonsmoker.   ROS: GEN: denies fever or chills, denies change in weight Skin: denies lesions or rashes HEENT: denies headache, earache, epistaxis, sore throat, or neck pain LUNGS: denies SHOB, dyspnea, PND, orthopnea CV: denies CP or palpitations ABD: denies abd pain, N or V (previously but subsided) EXT: denies muscle spasms or swelling; no pain in lower ext, no weakness NEURO: denies numbness or tingling, denies sz, stroke or TIA   ALLERGIES: No Known Allergies  PAST MEDICAL HISTORY: Past Medical History:  Diagnosis Date  . Anxiety    when heart skips a beat  . Diabetes mellitus without complication (HCC)   . Medical history non-contributory   . Varicose veins      MEDICATIONS AT HOME: Prior to Admission medications   Not on  File    Family History  Problem Relation Age of Onset  . Alcohol abuse Neg Hx   . Arthritis Neg Hx   . Asthma Neg Hx   . Birth defects Neg Hx   . Cancer Neg Hx   . COPD Neg Hx   . Depression Neg Hx   . Diabetes Neg Hx   . Drug abuse Neg Hx   . Early death Neg Hx   . Hearing loss Neg Hx   . Heart disease Neg Hx   . Hyperlipidemia Neg Hx   . Hypertension Neg Hx   . Kidney disease Neg Hx   . Learning disabilities Neg Hx   . Mental illness Neg Hx   . Mental retardation Neg Hx   . Miscarriages / Stillbirths Neg Hx   . Stroke Neg Hx   . Vision loss Neg Hx   . Varicose Veins Neg Hx      Objective:   Vitals:   10/11/17 0921  BP: 125/86  Pulse: 75  Resp: 12  Temp: (!) 97.4 F (36.3 C)  TempSrc: Oral  SpO2: 100%  Weight: 192 lb 9.6 oz (87.4 kg)    Exam General appearance : Awake, alert, not in any distress. Speech Clear. Not toxic looking HEENT: Atraumatic and Normocephalic, pupils equally reactive to light and accomodation Neck: supple, no JVD. No cervical lymphadenopathy.  Chest:Good air entry bilaterally, no added sounds  CVS: S1 S2 regular, no murmurs.  Abdomen: Bowel sounds present, Non tender and  not distended with no guarding, rigidity or rebound. Extremities: B/L Lower Ext shows no edema, both legs are warm to touch Neurology: Awake alert, and oriented X 3, CN II-XII intact, Non focal Skin:No Rash Wounds:N/A  Data Review Lab Results  Component Value Date   HGBA1C 5.9 (H) 05/29/2014     Assessment & Plan  1. DM Type 2  -Aim for 30 minutes of exercise most days. Rethink what you drink. Water is great! Aim for 2-3 Carb Choices per meal (30-45 grams) +/- 1 either way  Aim for 0-15 Carbs per snack if hungry  Include protein in moderation with your meals and snacks  Consider reading food labels for Total Carbohydrate and Fat Grams of foods  Consider checking BG at alternate times per day  Continue taking medication as directed Be mindful about  how much sugar you are adding to beverages and other foods. Fruit Punch - find one with no sugar  Measure and decrease portions of carbohydrate foods  Make your plate and don't go back for seconds  -keep a log and bring to next appt  -she wishes to try at least one more month of diet and  Exercise. We did discuss Metformin but not prescribed  today. Will send to DM education class.  2. Abdominal pain-subsided 3. Dizziness-subsided   Return in about 4 weeks (around 11/08/2017).  Total time spent with patient was 19 min. Greater than 50 % of this visit was spent face to face counseling and coordinating care regarding risk factor modification, compliance importance and encouragement, education related to diabetes.  This note has been created with Education officer, environmental. Any transcriptional errors are unintentional.    Scot Jun, PA-C [facility] and [facility] [place], [place] [phone]   10/11/2017, 10:02 AM

## 2017-10-29 ENCOUNTER — Ambulatory Visit: Payer: Medicaid Other | Admitting: Registered"

## 2017-11-05 ENCOUNTER — Encounter: Payer: Medicaid Other | Attending: Physician Assistant | Admitting: Registered"

## 2017-11-05 ENCOUNTER — Encounter: Payer: Self-pay | Admitting: Registered"

## 2017-11-05 DIAGNOSIS — Z713 Dietary counseling and surveillance: Secondary | ICD-10-CM | POA: Insufficient documentation

## 2017-11-05 DIAGNOSIS — E119 Type 2 diabetes mellitus without complications: Secondary | ICD-10-CM | POA: Insufficient documentation

## 2017-11-05 NOTE — Progress Notes (Signed)
Diabetes Self-Management Education  Visit Type: First/Initial  Appt. Start Time: 0935 Appt. End Time: 1040  11/05/2017  [name], identified by name and date of birth, is a 32 y.o. female with a diagnosis of Diabetes: Type 2.   ASSESSMENT Last menstrual period 10/09/2017, currently breastfeeding.  Patient has made diet and lifestyle changes since getting diagnosis. Patient states she has been eating smaller portions and cut out red meat, fruit and juice. Patient states she had GDM with last pregnancy and took medication, but did not learn about carb counting.  Diabetes Self-Management Education - 11/05/17 0952      Visit Information   Visit Type  First/Initial      Initial Visit   Diabetes Type  Type 2    Are you currently following a meal plan?  Yes    What type of meal plan do you follow?  portion control, low sugar    Are you taking your medications as prescribed?  Not on Medications      Health Coping   How would you rate your overall health?  Excellent      Psychosocial Assessment   Patient Belief/Attitude about Diabetes  Motivated to manage diabetes    Self-care barriers  Other (comment) does not speak English    Other persons present  Interpreter    What is the last grade level you completed in school?  8th      Complications   Last HgB A1C per patient/outside source  7 % per MD notes in chart    How often do you check your blood sugar?  1-2 times/day    Fasting Blood glucose range (mg/dL)  [phone]    Postprandial Blood glucose range (mg/dL)  [phone] 21H90s    Number of hypoglycemic episodes per month  0    Number of hyperglycemic episodes per week  0    Have you had a dilated eye exam in the past 12 months?  No    Have you had a dental exam in the past 12 months?  No    Are you checking your feet?  Yes    How many days per week are you checking your feet?  1      Dietary Intake   Breakfast  oatmeal    Snack (morning)  bread & peanut butter    Lunch  Flat bread, vegetable, beans    Dinner  brown rice, broccoli OR same as lunch    Beverage(s)  water, hot tea, coffee       Exercise   Exercise Type  Light (walking / raking leaves) 3 milese    How many days per week to you exercise?  7    How many minutes per day do you exercise?  30    Total minutes per week of exercise  210      Patient Education   Previous Diabetes Education  No    Disease state   Definition of diabetes, type 1 and 2, and the diagnosis of diabetes    Nutrition management   Role of diet in the treatment of diabetes and the relationship between the three main macronutrients and blood glucose level;Carbohydrate counting    Physical activity and exercise   Role of exercise on diabetes management, blood pressure control and cardiac health.    Monitoring  Identified appropriate SMBG and/or A1C goals.    Psychosocial adjustment  Role of stress on diabetes      Individualized Goals (developed  by patient)   Nutrition  General guidelines for healthy choices and portions discussed    Physical Activity  Exercise 5-7 days per week    Monitoring   test my blood glucose as discussed      Outcomes   Expected Outcomes  Demonstrated interest in learning. Expect positive outcomes    Future DMSE  PRN    Program Status  Completed     Individualized Plan for Diabetes Self-Management Training:   Learning Objective:  Patient will have a greater understanding of diabetes self-management. Patient education plan is to attend individual and/or group sessions per assessed needs and concerns.   Plan:  Continue eating 3 meals per day (and snacks as needed), aiming to have balance with protein and non-starchy vegetables. Continue regular exercise Continue checking your blood sugar as directed by doctor. Continue to have blood sugar (A1c) checked annually.  Expected Outcomes:  Demonstrated interest in learning. Expect positive outcomes  Education material provided: Living Well with  Diabetes, A1C conversion sheet and My Plate, Let's Count Carbs (visual practice plate handout)  If problems or questions, patient to contact team via:  Phone  Future DSME appointment: PRN

## 2017-11-12 ENCOUNTER — Encounter: Payer: Self-pay | Admitting: Family Medicine

## 2017-11-12 ENCOUNTER — Ambulatory Visit: Payer: Self-pay | Attending: Family Medicine | Admitting: Family Medicine

## 2017-11-12 ENCOUNTER — Other Ambulatory Visit: Payer: Self-pay | Admitting: Physician Assistant

## 2017-11-12 VITALS — BP 106/70 | HR 74 | Temp 97.3°F | Wt 191.6 lb

## 2017-11-12 DIAGNOSIS — Z23 Encounter for immunization: Secondary | ICD-10-CM | POA: Insufficient documentation

## 2017-11-12 DIAGNOSIS — E119 Type 2 diabetes mellitus without complications: Secondary | ICD-10-CM | POA: Insufficient documentation

## 2017-11-12 DIAGNOSIS — F419 Anxiety disorder, unspecified: Secondary | ICD-10-CM | POA: Insufficient documentation

## 2017-11-12 DIAGNOSIS — Z9889 Other specified postprocedural states: Secondary | ICD-10-CM | POA: Insufficient documentation

## 2017-11-12 LAB — POCT GLYCOSYLATED HEMOGLOBIN (HGB A1C): HEMOGLOBIN A1C: 7

## 2017-11-12 LAB — GLUCOSE, POCT (MANUAL RESULT ENTRY): POC Glucose: 104 mg/dl — AB (ref 70–99)

## 2017-11-12 MED ORDER — METFORMIN HCL 500 MG PO TABS: 500.0000 mg | ORAL_TABLET | Freq: Every day | ORAL | 3 refills | Status: DC

## 2017-11-12 MED FILL — ?METFORMIN HCL 500MG TABLET: 500 | 30 days supply | Qty: 30 | Fill #0

## 2017-11-12 NOTE — Patient Instructions (Signed)
Diabetes Mellitus and Nutrition When you have diabetes (diabetes mellitus), it is very important to have healthy eating habits because your blood sugar (glucose) levels are greatly affected by what you eat and drink. Eating healthy foods in the appropriate amounts, at about the same times every day, can help you:  Control your blood glucose.  Lower your risk of heart disease.  Improve your blood pressure.  Reach or maintain a healthy weight.  Every person with diabetes is different, and each person has different needs for a meal plan. Your health care provider may recommend that you work with a diet and nutrition specialist (dietitian) to make a meal plan that is best for you. Your meal plan may vary depending on factors such as:  The calories you need.  The medicines you take.  Your weight.  Your blood glucose, blood pressure, and cholesterol levels.  Your activity level.  Other health conditions you have, such as heart or kidney disease.  How do carbohydrates affect me? Carbohydrates affect your blood glucose level more than any other type of food. Eating carbohydrates naturally increases the amount of glucose in your blood. Carbohydrate counting is a method for keeping track of how many carbohydrates you eat. Counting carbohydrates is important to keep your blood glucose at a healthy level, especially if you use insulin or take certain oral diabetes medicines. It is important to know how many carbohydrates you can safely have in each meal. This is different for every person. Your dietitian can help you calculate how many carbohydrates you should have at each meal and for snack. Foods that contain carbohydrates include:  Bread, cereal, rice, pasta, and crackers.  Potatoes and corn.  Peas, beans, and lentils.  Milk and yogurt.  Fruit and juice.  Desserts, such as cakes, cookies, ice cream, and candy.  How does alcohol affect me? Alcohol can cause a sudden decrease in blood  glucose (hypoglycemia), especially if you use insulin or take certain oral diabetes medicines. Hypoglycemia can be a life-threatening condition. Symptoms of hypoglycemia (sleepiness, dizziness, and confusion) are similar to symptoms of having too much alcohol. If your health care provider says that alcohol is safe for you, follow these guidelines:  Limit alcohol intake to no more than 1 drink per day for nonpregnant women and 2 drinks per day for men. One drink equals 12 oz of beer, 5 oz of wine, or 1 oz of hard liquor.  Do not drink on an empty stomach.  Keep yourself hydrated with water, diet soda, or unsweetened iced tea.  Keep in mind that regular soda, juice, and other mixers may contain a lot of sugar and must be counted as carbohydrates.  What are tips for following this plan? Reading food labels  Start by checking the serving size on the label. The amount of calories, carbohydrates, fats, and other nutrients listed on the label are based on one serving of the food. Many foods contain more than one serving per package.  Check the total grams (g) of carbohydrates in one serving. You can calculate the number of servings of carbohydrates in one serving by dividing the total carbohydrates by 15. For example, if a food has 30 g of total carbohydrates, it would be equal to 2 servings of carbohydrates.  Check the number of grams (g) of saturated and trans fats in one serving. Choose foods that have low or no amount of these fats.  Check the number of milligrams (mg) of sodium in one serving. Most people   should limit total sodium intake to less than 2,300 mg per day.  Always check the nutrition information of foods labeled as "low-fat" or "nonfat". These foods may be higher in added sugar or refined carbohydrates and should be avoided.  Talk to your dietitian to identify your daily goals for nutrients listed on the label. Shopping  Avoid buying canned, premade, or processed foods. These  foods tend to be high in fat, sodium, and added sugar.  Shop around the outside edge of the grocery store. This includes fresh fruits and vegetables, bulk grains, fresh meats, and fresh dairy. Cooking  Use low-heat cooking methods, such as baking, instead of high-heat cooking methods like deep frying.  Cook using healthy oils, such as olive, canola, or sunflower oil.  Avoid cooking with butter, cream, or high-fat meats. Meal planning  Eat meals and snacks regularly, preferably at the same times every day. Avoid going long periods of time without eating.  Eat foods high in fiber, such as fresh fruits, vegetables, beans, and whole grains. Talk to your dietitian about how many servings of carbohydrates you can eat at each meal.  Eat 4-6 ounces of lean protein each day, such as lean meat, chicken, fish, eggs, or tofu. 1 ounce is equal to 1 ounce of meat, chicken, or fish, 1 egg, or 1/4 cup of tofu.  Eat some foods each day that contain healthy fats, such as avocado, nuts, seeds, and fish. Lifestyle   Check your blood glucose regularly.  Exercise at least 30 minutes 5 or more days each week, or as told by your health care provider.  Take medicines as told by your health care provider.  Do not use any products that contain nicotine or tobacco, such as cigarettes and e-cigarettes. If you need help quitting, ask your health care provider.  Work with a counselor or diabetes educator to identify strategies to manage stress and any emotional and social challenges. What are some questions to ask my health care provider?  Do I need to meet with a diabetes educator?  Do I need to meet with a dietitian?  What number can I call if I have questions?  When are the best times to check my blood glucose? Where to find more information:  American Diabetes Association: diabetes.org/food-and-fitness/food  Academy of Nutrition and Dietetics:  www.eatright.org/resources/health/diseases-and-conditions/diabetes  National Institute of Diabetes and Digestive and Kidney Diseases (NIH): www.niddk.nih.gov/health-information/diabetes/overview/diet-eating-physical-activity Summary  A healthy meal plan will help you control your blood glucose and maintain a healthy lifestyle.  Working with a diet and nutrition specialist (dietitian) can help you make a meal plan that is best for you.  Keep in mind that carbohydrates and alcohol have immediate effects on your blood glucose levels. It is important to count carbohydrates and to use alcohol carefully. This information is not intended to replace advice given to you by your health care provider. Make sure you discuss any questions you have with your health care provider. Document Released: 06/22/2005 Document Revised: 10/30/2016 Document Reviewed: 10/30/2016 Elsevier Interactive Patient Education  2018 Elsevier Inc.  

## 2017-11-12 NOTE — Progress Notes (Signed)
Subjective:  Patient ID: Penny Barber, female    DOB: September 10, 1986  Age: 32 y.o. MRN: [id number]  CC: Diabetes and Establish Care   HPI Penny Barber is a 32 year old female with a history of gestational diabetes mellitus and newly diagnosed diabetes mellitus with A1c of 7.0 who presents today for a follow-up visit. She has been checking her blood sugars in blood sugar log reveals random sugars between 95 and 152. She denies hypoglycemia. She denies numbness in extremities or visual concerns and has not had a recent eye exam. She is seen with the aid of an interpreter today and has no additional concerns today.  Past Medical History:  Diagnosis Date  . Anxiety    when heart skips a beat  . Diabetes mellitus without complication ([place])   . Medical history non-contributory   . Varicose veins     Past Surgical History:  Procedure Laterality Date  . CESAREAN SECTION    . CESAREAN SECTION N/A 09/10/2014   Procedure: CESAREAN SECTION;  Surgeon: Woodroe Mode, MD;  Location: [facility];  Service: Obstetrics;  Laterality: N/A;    No Known Allergies   No outpatient medications prior to visit.   No facility-administered medications prior to visit.     ROS Review of Systems  Constitutional: Negative for activity change, appetite change and fatigue.  HENT: Negative for congestion, sinus pressure and sore throat.   Eyes: Negative for visual disturbance.  Respiratory: Negative for cough, chest tightness, shortness of breath and wheezing.   Cardiovascular: Negative for chest pain and palpitations.  Gastrointestinal: Negative for abdominal distention, abdominal pain and constipation.  Endocrine: Negative for polydipsia.  Genitourinary: Negative for dysuria and frequency.  Musculoskeletal: Negative for arthralgias and back pain.  Skin: Negative for rash.  Neurological: Negative for tremors, light-headedness and numbness.  Hematological: Does not bruise/bleed easily.    Psychiatric/Behavioral: Negative for agitation and behavioral problems.    Objective:  BP 106/70   Pulse 74   Temp (!) 97.3 F (36.3 C) (Oral)   Wt 191 lb 9.6 oz (86.9 kg)   LMP 10/09/2017   SpO2 99%   BMI 31.92 kg/m   BP/Weight 11/12/2017 05/10/5002 7/0/4888  Systolic BP 916 945 038  Diastolic BP 70 86 73  Wt. (Lbs) 191.6 192.6 183.4  BMI 31.92 32.09 30.56      Physical Exam  Constitutional: She is oriented to person, place, and time. She appears well-developed and well-nourished.  Cardiovascular: Normal rate, normal heart sounds and intact distal pulses.  No murmur heard. Pulmonary/Chest: Effort normal and breath sounds normal. She has no wheezes. She has no rales. She exhibits no tenderness.  Abdominal: Soft. Bowel sounds are normal. She exhibits no distension and no mass. There is no tenderness.  Musculoskeletal: Normal range of motion.  Neurological: She is alert and oriented to person, place, and time.  Skin: Skin is warm and dry.  Psychiatric: She has a normal mood and affect.     Lab Results  Component Value Date   HGBA1C 7.0 11/12/2017    Assessment & Plan:   1. Newly diagnosed diabetes ([name]) Newly diagnosed with A1c of 7.0 Commence metformin Foot exam, Pneumovax today Encouraged to schedule annual eye exam with optometry at [facility] as she has no medical coverage for referral We will consider statin at next visit Counseled on Diabetic diet, my plate method, 882 minutes of moderate intensity exercise/week Keep blood sugar logs with fasting goals of 80-120 mg/dl, random of less than 180 and  in the event of sugars less than 60 mg/dl or greater than 400 mg/dl please notify the clinic ASAP. It is recommended that you undergo annual eye exams and annual foot exams. Pneumonia vaccine is recommended. - POCT glucose (manual entry) - POCT glycosylated hemoglobin (Hb A1C) - CMP14+EGFR; Future - Lipid panel; Future - Microalbumin/Creatinine Ratio, Urine; Future -  metFORMIN (GLUCOPHAGE) 500 MG tablet; Take 1 tablet (500 mg total) by mouth daily with breakfast.  Dispense: 30 tablet; Refill: 3  2. Need for vaccination against Streptococcus pneumoniae - Pneumococcal polysaccharide vaccine 23-valent greater than or equal to 2yo subcutaneous/IM   Meds ordered this encounter  Medications  . metFORMIN (GLUCOPHAGE) 500 MG tablet    Sig: Take 1 tablet (500 mg total) by mouth daily with breakfast.    Dispense:  30 tablet    Refill:  3    Follow-up: Return in about 3 months (around 02/09/2018) for Follow-up of diabetes mellitus.   Charlott Rakes MD

## 2017-11-13 ENCOUNTER — Ambulatory Visit: Payer: Self-pay | Attending: Family Medicine

## 2017-11-13 DIAGNOSIS — E119 Type 2 diabetes mellitus without complications: Secondary | ICD-10-CM | POA: Insufficient documentation

## 2017-11-13 NOTE — Progress Notes (Signed)
Patient here for lab visit  

## 2017-11-14 LAB — CMP14+EGFR
A/G RATIO: 1.6 (ref 1.2–2.2)
ALBUMIN: 4.5 g/dL (ref 3.5–5.5)
ALT: 15 IU/L (ref 0–32)
AST: 18 IU/L (ref 0–40)
Alkaline Phosphatase: 83 IU/L (ref 39–117)
BILIRUBIN TOTAL: 0.6 mg/dL (ref 0.0–1.2)
BUN / CREAT RATIO: 24 — AB (ref 9–23)
BUN: 17 mg/dL (ref 6–20)
CHLORIDE: 100 mmol/L (ref 96–106)
CO2: 19 mmol/L — ABNORMAL LOW (ref 20–29)
Calcium: 9.2 mg/dL (ref 8.7–10.2)
Creatinine, Ser: 0.72 mg/dL (ref 0.57–1.00)
GFR calc non Af Amer: 110 mL/min/{1.73_m2} (ref >59)
GFR, EST AFRICAN AMERICAN: 127 mL/min/{1.73_m2} (ref >59)
GLOBULIN, TOTAL: 2.8 g/dL (ref 1.5–4.5)
Glucose: 116 mg/dL — ABNORMAL HIGH (ref 65–99)
POTASSIUM: 3.8 mmol/L (ref 3.5–5.2)
SODIUM: 138 mmol/L (ref 134–144)
TOTAL PROTEIN: 7.3 g/dL (ref 6.0–8.5)

## 2017-11-14 LAB — LIPID PANEL
CHOL/HDL RATIO: 3.8 ratio (ref 0.0–4.4)
Cholesterol, Total: 184 mg/dL (ref 100–199)
HDL: 48 mg/dL (ref >39)
LDL Calculated: 117 mg/dL — ABNORMAL HIGH (ref 0–99)
TRIGLYCERIDES: 94 mg/dL (ref 0–149)
VLDL Cholesterol Cal: 19 mg/dL (ref 5–40)

## 2017-11-14 LAB — MICROALBUMIN / CREATININE URINE RATIO
Creatinine, Urine: 171.2 mg/dL
MICROALB/CREAT RATIO: 2 mg/g{creat} (ref 0.0–30.0)
Microalbumin, Urine: 3.4 ug/mL

## 2017-11-15 ENCOUNTER — Other Ambulatory Visit: Payer: Self-pay | Admitting: Family Medicine

## 2017-11-15 MED ORDER — ATORVASTATIN CALCIUM 20 MG PO TABS: 20.0000 mg | ORAL_TABLET | Freq: Every day | ORAL | 3 refills | Status: DC

## 2017-11-15 MED FILL — ?ATORVASTATIN 20MG TABL: 20 | 30 days supply | Qty: 30 | Fill #0

## 2017-11-19 ENCOUNTER — Telehealth: Payer: Self-pay

## 2017-11-19 NOTE — Telephone Encounter (Signed)
-----   Message from Hoy RegisterEnobong Newlin, MD sent at 11/15/2017  2:19 PM EST ----- Cholesterol is slightly elevated and I have sent a prescription for atorvastatin to her pharmacy.  Please advise to work on exercise on a low-cholesterol diet

## 2017-11-19 NOTE — Telephone Encounter (Signed)
CMA called patient to inform on lab result and Rx been sent.  Patient understood.

## 2017-11-21 NOTE — Progress Notes (Signed)
Patient came in person requested to have her lab print out.

## 2017-11-21 NOTE — Progress Notes (Signed)
Patient came in person to request to have her lab result printed out.

## 2017-12-13 MED FILL — metFORMIN HCL 500 MG TABS: 500 | 30 days supply | Qty: 30 | Fill #1

## 2017-12-13 MED FILL — ATORVASTATIN 20 MG TABLET: 20 | 30 days supply | Qty: 30 | Fill #1

## 2018-01-15 MED FILL — metFORMIN HCL 500 MG TABS: 500 | 30 days supply | Qty: 30 | Fill #2

## 2018-01-15 MED FILL — ATORVASTATIN 20 MG TABLET: 20 | 30 days supply | Qty: 30 | Fill #2

## 2018-02-12 ENCOUNTER — Encounter: Payer: Self-pay | Admitting: Family Medicine

## 2018-02-12 ENCOUNTER — Ambulatory Visit: Payer: Self-pay | Attending: Family Medicine | Admitting: Family Medicine

## 2018-02-12 VITALS — BP 102/65 | HR 64 | Temp 97.9°F | Wt 197.8 lb

## 2018-02-12 DIAGNOSIS — Z79899 Other long term (current) drug therapy: Secondary | ICD-10-CM | POA: Insufficient documentation

## 2018-02-12 DIAGNOSIS — E119 Type 2 diabetes mellitus without complications: Secondary | ICD-10-CM | POA: Insufficient documentation

## 2018-02-12 DIAGNOSIS — Z7984 Long term (current) use of oral hypoglycemic drugs: Secondary | ICD-10-CM | POA: Insufficient documentation

## 2018-02-12 DIAGNOSIS — F419 Anxiety disorder, unspecified: Secondary | ICD-10-CM | POA: Insufficient documentation

## 2018-02-12 LAB — GLUCOSE, POCT (MANUAL RESULT ENTRY): POC Glucose: 104 mg/dl — AB (ref 70–99)

## 2018-02-12 LAB — POCT GLYCOSYLATED HEMOGLOBIN (HGB A1C): HEMOGLOBIN A1C: 6.2

## 2018-02-12 MED ORDER — ATORVASTATIN CALCIUM 20 MG PO TABS: 20.0000 mg | ORAL_TABLET | Freq: Every day | ORAL | 3 refills | Status: DC

## 2018-02-12 MED ORDER — METFORMIN HCL 500 MG PO TABS: 500.0000 mg | ORAL_TABLET | Freq: Every day | ORAL | 3 refills | Status: DC

## 2018-02-12 MED FILL — metFORMIN HCL 500 MG TABS: 500 | 30 days supply | Qty: 30 | Fill #0

## 2018-02-12 MED FILL — ATORVASTATIN 20 MG TABLET: 20 | 30 days supply | Qty: 30 | Fill #0

## 2018-02-12 NOTE — Progress Notes (Signed)
Subjective:  Patient ID: Penny Barber, female    DOB: 15-Sep-1986  Age: 32 y.o. MRN: [id number]  CC: Diabetes   HPI Penny Barber is a 32 year old female with a history of type 2 diabetes mellitus (A1c 6.1) who presents today for a follow-up visit. Her A1c is improved from 7.0 down to 6.1 ever since she was commenced on metformin and she endorses exercising regularly and compliant with a diabetic diet.  She denies visual concerns, numbness in extremities. Informs me that she skips metformin intermittently especially when her fasting sugars are in the 80-90 range. Denies chest pains or shortness of breath. With regards to her healthcare maintenance she [facility]  Past Medical History:  Diagnosis Date  . Anxiety    when heart skips a beat  . Diabetes mellitus without complication ([facility])   . Medical history non-contributory   . Varicose veins     Past Surgical History:  Procedure Laterality Date  . CESAREAN SECTION    . CESAREAN SECTION N/A 09/10/2014   Procedure: CESAREAN SECTION;  Surgeon: Adam Phenix, MD;  Location: [facility];  Service: Obstetrics;  Laterality: N/A;    No Known Allergies   Outpatient Medications Prior to Visit  Medication Sig Dispense Refill  . atorvastatin (LIPITOR) 20 MG tablet Take 1 tablet (20 mg total) by mouth daily. 30 tablet 3  . metFORMIN (GLUCOPHAGE) 500 MG tablet Take 1 tablet (500 mg total) by mouth daily with breakfast. 30 tablet 3   No facility-administered medications prior to visit.     ROS Review of Systems  Constitutional: Negative for activity change, appetite change and fatigue.  HENT: Negative for congestion, sinus pressure and sore throat.   Eyes: Negative for visual disturbance.  Respiratory: Negative for cough, chest tightness, shortness of breath and wheezing.   Cardiovascular: Negative for chest pain and palpitations.  Gastrointestinal: Negative for abdominal distention, abdominal  pain and constipation.  Endocrine: Negative for polydipsia.  Genitourinary: Negative for dysuria and frequency.  Musculoskeletal: Negative for arthralgias and back pain.  Skin: Negative for rash.  Neurological: Negative for tremors, light-headedness and numbness.  Hematological: Does not bruise/bleed easily.  Psychiatric/Behavioral: Negative for agitation and behavioral problems.    Objective:  BP 102/65   Pulse 64   Temp 97.9 F (36.6 C) (Oral)   Wt 197 lb 12.8 oz (89.7 kg)   SpO2 99%   BMI 32.96 kg/m   BP/Weight 02/12/2018 11/12/2017 10/11/2017  Systolic BP 102 106 125  Diastolic BP 65 70 86  Wt. (Lbs) 197.8 191.6 192.6  BMI 32.96 31.92 32.09      Physical Exam  Constitutional: She is oriented to person, place, and time. She appears well-developed and well-nourished.  Cardiovascular: Normal rate, normal heart sounds and intact distal pulses.  No murmur heard. Pulmonary/Chest: Effort normal and breath sounds normal. She has no wheezes. She has no rales. She exhibits no tenderness.  Abdominal: Soft. Bowel sounds are normal. She exhibits no distension and no mass. There is no tenderness.  Musculoskeletal: Normal range of motion.  Neurological: She is alert and oriented to person, place, and time.  Psychiatric: She has a normal mood and affect.     CMP Latest Ref Rng & Units 11/13/2017 07/03/2014 06/03/2014  Glucose 65 - 99 mg/dL 409(W) 92 90  BUN 6 - 20 mg/dL 17 - 6  Creatinine 1.19 - 1.00 mg/dL 1.47 - 8.29(F)  Sodium 134 - 144 mmol/L 138 - 137  Potassium 3.5 - 5.2 mmol/L 3.8 - 3.9  Chloride 96 - 106 mmol/L 100 - 106  CO2 20 - 29 mmol/L 19(L) - 21  Calcium 8.7 - 10.2 mg/dL 9.2 - 8.3(L)  Total Protein 6.0 - 8.5 g/dL 7.3 - 5.7(L)  Total Bilirubin 0.0 - 1.2 mg/dL 0.6 - <1.6(X)  Alkaline Phos 39 - 117 IU/L 83 - 67  AST 0 - 40 IU/L 18 - 10  ALT 0 - 32 IU/L 15 - 5    Lipid Panel     Component Value Date/Time   CHOL 184 11/13/2017 0932   TRIG 94 11/13/2017 0932   HDL 48  11/13/2017 0932   CHOLHDL 3.8 11/13/2017 0932   CHOLHDL 2.9 10/08/2013 1142   VLDL 17 10/08/2013 1142   LDLCALC 117 (H) 11/13/2017 0932    Lab Results  Component Value Date   HGBA1C 6.2 02/12/2018    Assessment & Plan:   1. Type 2 diabetes mellitus without complication, without long-term current use of insulin (HCC) Controlled with A1c of 6.2 - POCT glucose (manual entry) - POCT glycosylated hemoglobin (Hb A1C) - metFORMIN (GLUCOPHAGE) 500 MG tablet; Take 1 tablet (500 mg total) by mouth daily with breakfast.  Dispense: 30 tablet; Refill: 3   Meds ordered this encounter  Medications  . atorvastatin (LIPITOR) 20 MG tablet    Sig: Take 1 tablet (20 mg total) by mouth daily.    Dispense:  30 tablet    Refill:  3  . metFORMIN (GLUCOPHAGE) 500 MG tablet    Sig: Take 1 tablet (500 mg total) by mouth daily with breakfast.    Dispense:  30 tablet    Refill:  3    Follow-up: Return in about 3 months (around 05/15/2018) for follow up on diabetes mellitus.   Hoy Register MD

## 2018-02-12 NOTE — Patient Instructions (Signed)
Diabetes Mellitus and Nutrition When you have diabetes (diabetes mellitus), it is very important to have healthy eating habits because your blood sugar (glucose) levels are greatly affected by what you eat and drink. Eating healthy foods in the appropriate amounts, at about the same times every day, can help you:  Control your blood glucose.  Lower your risk of heart disease.  Improve your blood pressure.  Reach or maintain a healthy weight.  Every person with diabetes is different, and each person has different needs for a meal plan. Your health care provider may recommend that you work with a diet and nutrition specialist (dietitian) to make a meal plan that is best for you. Your meal plan may vary depending on factors such as:  The calories you need.  The medicines you take.  Your weight.  Your blood glucose, blood pressure, and cholesterol levels.  Your activity level.  Other health conditions you have, such as heart or kidney disease.  How do carbohydrates affect me? Carbohydrates affect your blood glucose level more than any other type of food. Eating carbohydrates naturally increases the amount of glucose in your blood. Carbohydrate counting is a method for keeping track of how many carbohydrates you eat. Counting carbohydrates is important to keep your blood glucose at a healthy level, especially if you use insulin or take certain oral diabetes medicines. It is important to know how many carbohydrates you can safely have in each meal. This is different for every person. Your dietitian can help you calculate how many carbohydrates you should have at each meal and for snack. Foods that contain carbohydrates include:  Bread, cereal, rice, pasta, and crackers.  Potatoes and corn.  Peas, beans, and lentils.  Milk and yogurt.  Fruit and juice.  Desserts, such as cakes, cookies, ice cream, and candy.  How does alcohol affect me? Alcohol can cause a sudden decrease in blood  glucose (hypoglycemia), especially if you use insulin or take certain oral diabetes medicines. Hypoglycemia can be a life-threatening condition. Symptoms of hypoglycemia (sleepiness, dizziness, and confusion) are similar to symptoms of having too much alcohol. If your health care provider says that alcohol is safe for you, follow these guidelines:  Limit alcohol intake to no more than 1 drink per day for nonpregnant women and 2 drinks per day for men. One drink equals 12 oz of beer, 5 oz of wine, or 1 oz of hard liquor.  Do not drink on an empty stomach.  Keep yourself hydrated with water, diet soda, or unsweetened iced tea.  Keep in mind that regular soda, juice, and other mixers may contain a lot of sugar and must be counted as carbohydrates.  What are tips for following this plan? Reading food labels  Start by checking the serving size on the label. The amount of calories, carbohydrates, fats, and other nutrients listed on the label are based on one serving of the food. Many foods contain more than one serving per package.  Check the total grams (g) of carbohydrates in one serving. You can calculate the number of servings of carbohydrates in one serving by dividing the total carbohydrates by 15. For example, if a food has 30 g of total carbohydrates, it would be equal to 2 servings of carbohydrates.  Check the number of grams (g) of saturated and trans fats in one serving. Choose foods that have low or no amount of these fats.  Check the number of milligrams (mg) of sodium in one serving. Most people   should limit total sodium intake to less than 2,300 mg per day.  Always check the nutrition information of foods labeled as "low-fat" or "nonfat". These foods may be higher in added sugar or refined carbohydrates and should be avoided.  Talk to your dietitian to identify your daily goals for nutrients listed on the label. Shopping  Avoid buying canned, premade, or processed foods. These  foods tend to be high in fat, sodium, and added sugar.  Shop around the outside edge of the grocery store. This includes fresh fruits and vegetables, bulk grains, fresh meats, and fresh dairy. Cooking  Use low-heat cooking methods, such as baking, instead of high-heat cooking methods like deep frying.  Cook using healthy oils, such as olive, canola, or sunflower oil.  Avoid cooking with butter, cream, or high-fat meats. Meal planning  Eat meals and snacks regularly, preferably at the same times every day. Avoid going long periods of time without eating.  Eat foods high in fiber, such as fresh fruits, vegetables, beans, and whole grains. Talk to your dietitian about how many servings of carbohydrates you can eat at each meal.  Eat 4-6 ounces of lean protein each day, such as lean meat, chicken, fish, eggs, or tofu. 1 ounce is equal to 1 ounce of meat, chicken, or fish, 1 egg, or 1/4 cup of tofu.  Eat some foods each day that contain healthy fats, such as avocado, nuts, seeds, and fish. Lifestyle   Check your blood glucose regularly.  Exercise at least 30 minutes 5 or more days each week, or as told by your health care provider.  Take medicines as told by your health care provider.  Do not use any products that contain nicotine or tobacco, such as cigarettes and e-cigarettes. If you need help quitting, ask your health care provider.  Work with a counselor or diabetes educator to identify strategies to manage stress and any emotional and social challenges. What are some questions to ask my health care provider?  Do I need to meet with a diabetes educator?  Do I need to meet with a dietitian?  What number can I call if I have questions?  When are the best times to check my blood glucose? Where to find more information:  American Diabetes Association: diabetes.org/food-and-fitness/food  Academy of Nutrition and Dietetics:  www.eatright.org/resources/health/diseases-and-conditions/diabetes  National Institute of Diabetes and Digestive and Kidney Diseases (NIH): www.niddk.nih.gov/health-information/diabetes/overview/diet-eating-physical-activity Summary  A healthy meal plan will help you control your blood glucose and maintain a healthy lifestyle.  Working with a diet and nutrition specialist (dietitian) can help you make a meal plan that is best for you.  Keep in mind that carbohydrates and alcohol have immediate effects on your blood glucose levels. It is important to count carbohydrates and to use alcohol carefully. This information is not intended to replace advice given to you by your health care provider. Make sure you discuss any questions you have with your health care provider. Document Released: 06/22/2005 Document Revised: 10/30/2016 Document Reviewed: 10/30/2016 Elsevier Interactive Patient Education  2018 Elsevier Inc.  

## 2018-05-20 ENCOUNTER — Ambulatory Visit: Payer: Self-pay | Attending: Family Medicine | Admitting: Family Medicine

## 2018-05-20 ENCOUNTER — Encounter: Payer: Self-pay | Admitting: Family Medicine

## 2018-05-20 ENCOUNTER — Ambulatory Visit: Payer: Medicaid Other | Admitting: Family Medicine

## 2018-05-20 VITALS — BP 119/79 | HR 67 | Temp 98.2°F | Wt 188.6 lb

## 2018-05-20 DIAGNOSIS — E785 Hyperlipidemia, unspecified: Secondary | ICD-10-CM | POA: Insufficient documentation

## 2018-05-20 DIAGNOSIS — Z79899 Other long term (current) drug therapy: Secondary | ICD-10-CM | POA: Insufficient documentation

## 2018-05-20 DIAGNOSIS — Z7984 Long term (current) use of oral hypoglycemic drugs: Secondary | ICD-10-CM | POA: Insufficient documentation

## 2018-05-20 DIAGNOSIS — E7800 Pure hypercholesterolemia, unspecified: Secondary | ICD-10-CM

## 2018-05-20 DIAGNOSIS — R51 Headache: Secondary | ICD-10-CM

## 2018-05-20 DIAGNOSIS — R519 Headache, unspecified: Secondary | ICD-10-CM

## 2018-05-20 DIAGNOSIS — E119 Type 2 diabetes mellitus without complications: Secondary | ICD-10-CM

## 2018-05-20 LAB — POCT GLYCOSYLATED HEMOGLOBIN (HGB A1C): HbA1c, POC (controlled diabetic range): 5.8 % (ref 0.0–7.0)

## 2018-05-20 LAB — GLUCOSE, POCT (MANUAL RESULT ENTRY): POC Glucose: 124 mg/dl — AB (ref 70–99)

## 2018-05-20 MED ORDER — ATORVASTATIN CALCIUM 20 MG PO TABS: 20.0000 mg | ORAL_TABLET | Freq: Every day | ORAL | 6 refills | Status: DC

## 2018-05-20 MED ORDER — METFORMIN HCL 500 MG PO TABS: 500.0000 mg | ORAL_TABLET | Freq: Every day | ORAL | 6 refills | Status: DC

## 2018-05-20 MED ORDER — ACETAMINOPHEN 500 MG PO TABS
500.0000 mg | ORAL_TABLET | Freq: Once | ORAL | Status: AC
  Administered 2018-05-20: 500 mg via ORAL

## 2018-05-20 MED FILL — ATORVASTATIN 20 MG TABLET: 20 | 30 days supply | Qty: 30 | Fill #3

## 2018-05-20 MED FILL — metFORMIN HCL 500 MG TABS: 500 | 30 days supply | Qty: 30 | Fill #3

## 2018-05-20 NOTE — Patient Instructions (Signed)

## 2018-05-21 LAB — CMP14+EGFR
A/G RATIO: 1.7 (ref 1.2–2.2)
ALT: 12 IU/L (ref 0–32)
AST: 19 IU/L (ref 0–40)
Albumin: 4.2 g/dL (ref 3.5–5.5)
Alkaline Phosphatase: 70 IU/L (ref 39–117)
BILIRUBIN TOTAL: 0.6 mg/dL (ref 0.0–1.2)
BUN/Creatinine Ratio: 15 (ref 9–23)
BUN: 11 mg/dL (ref 6–20)
CALCIUM: 8.8 mg/dL (ref 8.7–10.2)
CHLORIDE: 104 mmol/L (ref 96–106)
CO2: 21 mmol/L (ref 20–29)
Creatinine, Ser: 0.73 mg/dL (ref 0.57–1.00)
GFR, EST AFRICAN AMERICAN: 125 mL/min/{1.73_m2} (ref >59)
GFR, EST NON AFRICAN AMERICAN: 109 mL/min/{1.73_m2} (ref >59)
GLOBULIN, TOTAL: 2.5 g/dL (ref 1.5–4.5)
Glucose: 114 mg/dL — ABNORMAL HIGH (ref 65–99)
POTASSIUM: 3.7 mmol/L (ref 3.5–5.2)
SODIUM: 138 mmol/L (ref 134–144)
TOTAL PROTEIN: 6.7 g/dL (ref 6.0–8.5)

## 2018-05-21 NOTE — Progress Notes (Signed)
Subjective:  Patient ID: Penny Barber, female    DOB: 1986/04/19  Age: 32 y.o. MRN: [id number]  CC: Diabetes   HPI Penny Barber is a 32 year old female with a history of type 2 diabetes mellitus (A1c 5.8) hyperlipidemia who presents today for follow-up visit. She presents accompanied by an interpreter and complains of headache which started a couple of hours ago but endorses not sleeping properly through the night.  Denies nausea, blurry vision, nausea. Her home sugars have ranged from 78-300 with 78 when she is  fasting sugars and occasional sugars in the 300s.  When her sugars as low as 78 she holds of her Metformin with resulting rebound elevation. She denies blurry vision, neuropathy.  Past Medical History:  Diagnosis Date  . Anxiety    when heart skips a beat  . Diabetes mellitus without complication ([place])   . Medical history non-contributory   . Varicose veins     Past Surgical History:  Procedure Laterality Date  . CESAREAN SECTION    . CESAREAN SECTION N/A 09/10/2014   Procedure: CESAREAN SECTION;  Surgeon: Woodroe Mode, MD;  Location: [facility];  Service: Obstetrics;  Laterality: N/A;    No Known Allergies   Outpatient Medications Prior to Visit  Medication Sig Dispense Refill  . atorvastatin (LIPITOR) 20 MG tablet Take 1 tablet (20 mg total) by mouth daily. 30 tablet 3  . metFORMIN (GLUCOPHAGE) 500 MG tablet Take 1 tablet (500 mg total) by mouth daily with breakfast. 30 tablet 3   No facility-administered medications prior to visit.     ROS Review of Systems  Constitutional: Negative for activity change, appetite change and fatigue.  HENT: Negative for congestion, sinus pressure and sore throat.   Eyes: Negative for visual disturbance.  Respiratory: Negative for cough, chest tightness, shortness of breath and wheezing.   Cardiovascular: Negative for chest pain and palpitations.  Gastrointestinal: Negative for abdominal distention, abdominal pain and  constipation.  Endocrine: Negative for polydipsia.  Genitourinary: Negative for dysuria and frequency.  Musculoskeletal: Negative for arthralgias and back pain.  Skin: Negative for rash.  Neurological: Positive for headaches. Negative for tremors, light-headedness and numbness.  Hematological: Does not bruise/bleed easily.  Psychiatric/Behavioral: Negative for agitation and behavioral problems.    Objective:  BP 119/79   Pulse 67   Temp 98.2 F (36.8 C) (Oral)   Wt 188 lb 9.6 oz (85.5 kg)   SpO2 100%   BMI 31.42 kg/m   BP/Weight 05/20/2018 11/14/7122 02/13/997  Systolic BP 338 250 539  Diastolic BP 79 65 70  Wt. (Lbs) 188.6 197.8 191.6  BMI 31.42 32.96 31.92      Physical Exam  Constitutional: She is oriented to person, place, and time. She appears well-developed and well-nourished.  Cardiovascular: Normal rate, normal heart sounds and intact distal pulses.  No murmur heard. Pulmonary/Chest: Effort normal and breath sounds normal. She has no wheezes. She has no rales. She exhibits no tenderness.  Abdominal: Soft. Bowel sounds are normal. She exhibits no distension and no mass. There is no tenderness.  Musculoskeletal: Normal range of motion.  Neurological: She is alert and oriented to person, place, and time.  Skin: Skin is warm and dry.   Lab Results  Component Value Date   HGBA1C 5.8 05/20/2018     Assessment & Plan:   1. Type 2 diabetes mellitus without complication, without long-term current use of insulin (HCC) Controlled with A1c of 5.8 Counseled on Diabetic diet, my plate method, 767 minutes  of moderate intensity exercise/week Keep blood sugar logs with fasting goals of 80-120 mg/dl, random of less than 180 and in the event of sugars less than 60 mg/dl or greater than 400 mg/dl please notify the clinic ASAP. It is recommended that you undergo annual eye exams and annual foot exams. Pneumonia vaccine is recommended. - POCT glucose (manual entry) - POCT  glycosylated hemoglobin (Hb A1C) - CMP14+EGFR - Lipid panel - metFORMIN (GLUCOPHAGE) 500 MG tablet; Take 1 tablet (500 mg total) by mouth daily with breakfast.  Dispense: 30 tablet; Refill: 6  2. Acute nonintractable headache, unspecified headache type Secondary to sleep deprivation Discussed the need for adequate sleep - acetaminophen (TYLENOL) tablet 500 mg  3. Pure hypercholesterolemia Controlled - atorvastatin (LIPITOR) 20 MG tablet; Take 1 tablet (20 mg total) by mouth daily.  Dispense: 30 tablet; Refill: 6   Meds ordered this encounter  Medications  . metFORMIN (GLUCOPHAGE) 500 MG tablet    Sig: Take 1 tablet (500 mg total) by mouth daily with breakfast.    Dispense:  30 tablet    Refill:  6  . atorvastatin (LIPITOR) 20 MG tablet    Sig: Take 1 tablet (20 mg total) by mouth daily.    Dispense:  30 tablet    Refill:  6  . acetaminophen (TYLENOL) tablet 500 mg    Follow-up: Return in about 6 months (around 11/20/2018) for follow up on Diabetes mellitus.   Charlott Rakes MD

## 2018-07-02 MED FILL — ATORVASTATIN 20 MG TABLET: 20 | 30 days supply | Qty: 30 | Fill #0

## 2018-07-02 MED FILL — metFORMIN HCL 500 MG TABS: 500 | 30 days supply | Qty: 30 | Fill #0

## 2018-07-04 ENCOUNTER — Ambulatory Visit: Payer: Self-pay | Attending: Family Medicine | Admitting: Physician Assistant

## 2018-07-04 VITALS — BP 113/79 | HR 66 | Temp 98.2°F | Resp 18 | Ht 64.0 in | Wt 189.0 lb

## 2018-07-04 DIAGNOSIS — E119 Type 2 diabetes mellitus without complications: Secondary | ICD-10-CM | POA: Insufficient documentation

## 2018-07-04 DIAGNOSIS — Z09 Encounter for follow-up examination after completed treatment for conditions other than malignant neoplasm: Secondary | ICD-10-CM

## 2018-07-04 DIAGNOSIS — Z7984 Long term (current) use of oral hypoglycemic drugs: Secondary | ICD-10-CM | POA: Insufficient documentation

## 2018-07-04 DIAGNOSIS — Z79899 Other long term (current) drug therapy: Secondary | ICD-10-CM | POA: Insufficient documentation

## 2018-07-04 DIAGNOSIS — Z789 Other specified health status: Secondary | ICD-10-CM

## 2018-07-04 DIAGNOSIS — B029 Zoster without complications: Secondary | ICD-10-CM | POA: Insufficient documentation

## 2018-07-04 DIAGNOSIS — N644 Mastodynia: Secondary | ICD-10-CM | POA: Insufficient documentation

## 2018-07-04 DIAGNOSIS — I8390 Asymptomatic varicose veins of unspecified lower extremity: Secondary | ICD-10-CM | POA: Insufficient documentation

## 2018-07-04 DIAGNOSIS — F419 Anxiety disorder, unspecified: Secondary | ICD-10-CM | POA: Insufficient documentation

## 2018-07-04 LAB — GLUCOSE, POCT (MANUAL RESULT ENTRY): POC Glucose: 172 mg/dl — AB (ref 70–99)

## 2018-07-04 MED ORDER — TRAMADOL HCL 50 MG PO TABS: 50.0000 mg | ORAL_TABLET | Freq: Three times a day (TID) | ORAL | 0 refills | Status: DC | PRN

## 2018-07-04 MED ORDER — VALACYCLOVIR HCL 1 G PO TABS: 1000.0000 mg | ORAL_TABLET | Freq: Two times a day (BID) | ORAL | 0 refills | Status: DC

## 2018-07-04 MED ORDER — NAPROXEN 500 MG PO TABS: 500.0000 mg | ORAL_TABLET | Freq: Two times a day (BID) | ORAL | 0 refills | Status: DC

## 2018-07-04 MED FILL — NAPROXEN 500 MG TABLET: 500 | 30 days supply | Qty: 60 | Fill #0

## 2018-07-04 MED FILL — traMADol HCL 50 MG TABS: 50 | 10 days supply | Qty: 30 | Fill #0

## 2018-07-04 MED FILL — valACYclovir HCL 1 GM TABS: 1 | 10 days supply | Qty: 21 | Fill #0

## 2018-07-04 NOTE — Patient Instructions (Signed)

## 2018-07-04 NOTE — Progress Notes (Signed)
Patient ID: Penny Barber, female   DOB: 12-Apr-1986, 32 y.o.   MRN: [id number]      Penny Barber, is a 32 y.o. female  [id number]  [id number]  DOB - 05/27/1986  Subjective:  Chief Complaint and HPI: Penny Barber is a 32 y.o. female here for a follow up visit  After being seen in the ED [facility]  07/02/2018 for L breast pain. Pregnancy test negative.  UA dip and CBC unremarkable.  troponins negative.  CMP negative.  EKG unremarkable.  CXR negative.  She was treated with naproxen.  LMP 06/09/2018.  The pain in her L breast and L side, upper L back is persisting and now she is getting a rash.  No fever/chills.  No N/V/D.  ED suggests mammogram.  Language resources "DG" with her.    From ED note: Impression: Patient presented with left breast pain. Left breast is diffusely tender on exam. She does not have any chest wall tenderness. She has no associated symptoms that would suggest cardiopulmonary cause. First look provider discussed in his notes that patient is complaining of chest pain. Patient does not have any actual chest pain, only breast pain. Patient is very clear that the pain is emanating from her left breast and that she does not have any associated symptoms. Nonetheless, cardiac work-up was ordered and completed. Chest x-ray shows no acute process. Pregnancy test is negative. CBC is negative for any leukocytosis, leukopenia, anemia, thrombocytopenia. Troponin is negative. CMP shows no electrolyte imbalance, renal impairment, or hepatic impairment. EKG shows normal sinus rhythm without acute arrhythmia or ischemia. Patient appears to have clear left breast pain without obvious cause. Plan is outpatient treatment of pain with Naprosyn and follow-up as scheduled with primary care. I discussed with patient that she will likely need to be scheduled for a mammogram.  ED/Hospital notes reviewed and summarized above.     Social History: has 3 children,  moved here from [place]  ROS:   Constitutional:  No f/c, No night sweats, No unexplained weight loss. EENT:  No vision changes, No blurry vision, No hearing changes. No mouth, throat, or ear problems.  Respiratory: No cough, No SOB Cardiac: No CP, no palpitations GI:  No abd pain, No N/V/D. GU: No Urinary s/sx Musculoskeletal:  L breast/chest/back Neuro: No headache, no dizziness, no motor weakness.  Skin: No rash Endocrine:  No polydipsia. No polyuria.  Psych: Denies SI/HI  No problems updated.  ALLERGIES: No Known Allergies  PAST MEDICAL HISTORY: Past Medical History:  Diagnosis Date  . Anxiety    when heart skips a beat  . Diabetes mellitus without complication (HCC)   . Medical history non-contributory   . Varicose veins     MEDICATIONS AT HOME: Prior to Admission medications   Medication Sig Start Date End Date Taking? Authorizing Provider  atorvastatin (LIPITOR) 20 MG tablet Take 1 tablet (20 mg total) by mouth daily. 05/20/18   Hoy Register, MD  metFORMIN (GLUCOPHAGE) 500 MG tablet Take 1 tablet (500 mg total) by mouth daily with breakfast. 05/20/18   Hoy Register, MD  naproxen (NAPROSYN) 500 MG tablet Take 1 tablet (500 mg total) by mouth 2 (two) times daily with a meal. 07/04/18   Kabria Hetzer, Marzella Schlein, PA-C  traMADol (ULTRAM) 50 MG tablet Take 1 tablet (50 mg total) by mouth every 8 (eight) hours as needed. 07/04/18   Anders Simmonds, PA-C  valACYclovir (VALTREX) 1000 MG tablet Take 1 tablet (1,000 mg total) by mouth  2 (two) times daily. 07/04/18   Anders Simmonds, PA-C     Objective:  EXAM:   Vitals:   07/04/18 1022  BP: 113/79    General appearance : A&OX3. NAD. Non-toxic-appearing HEENT: Atraumatic and Normocephalic.  PERRLA. EOM intact.  Mouth-MMM, post pharynx WNL w/o erythema, No PND. Neck: supple, no JVD. No cervical lymphadenopathy. No thyromegaly Chest/Lungs:  Breathing-non-labored, Good air entry bilaterally, breath sounds normal without rales,  rhonchi, or wheezing  CVS: S1 S2 regular, no murmurs, gallops, rubs  Abdomen: Bowel sounds present, Non tender and not distended with no gaurding, rigidity or rebound. L T4/T5 dermatome with early blistering on the back and along mid-axillary line.   Extremities: Bilateral Lower Ext shows no edema, both legs are warm to touch with = pulse throughout Neurology:  CN II-XII grossly intact, Non focal.   Psych:  TP linear. J/I WNL. Normal speech. Appropriate eye contact and affect.  Skin:  No Rash  Data Review Lab Results  Component Value Date   HGBA1C 5.8 05/20/2018   HGBA1C 6.2 02/12/2018   HGBA1C 7.0 11/12/2017     Assessment & Plan   1. Breast pain, left Per ED note-breast pain due to shingles but will proceed and do MMG when well - MM Digital Diagnostic Unilat L; Future  2. Type 2 diabetes mellitus without complication, without long-term current use of insulin (HCC) Continue current regimen.  Work on diabetic diet.  Last A1C=5.8, 05/2018-  Glucose (CBG)  3. Encounter for examination following treatment at hospital  4. Language barrier Language resources interpreter used and additional time performing visit was required.  5. Herpes zoster without complication - valACYclovir (VALTREX) 1000 MG tablet; Take 1 tablet (1,000 mg total) by mouth 2 (two) times daily.  Dispense: 21 tablet; Refill: 0 - traMADol (ULTRAM) 50 MG tablet; Take 1 tablet (50 mg total) by mouth every 8 (eight) hours as needed.  Dispense: 30 tablet; Refill: 0 - naproxen (NAPROSYN) 500 MG tablet; Take 1 tablet (500 mg total) by mouth 2 (two) times daily with a meal.  Dispense: 60 tablet; Refill: 0   Patient have been counseled extensively about nutrition and exercise  Return in about 3 months (around 10/03/2018) for Dr Alvis Lemmings; DM.  The patient was given clear instructions to go to ER or return to medical center if symptoms don't improve, worsen or new problems develop. The patient verbalized understanding. The  patient was told to call to get lab results if they haven't heard anything in the next week.     Georgian Co, PA-C [facility] [place], [place] [phone]   07/04/2018, 10:34 AM

## 2018-07-12 ENCOUNTER — Other Ambulatory Visit: Payer: Self-pay | Admitting: Physician Assistant

## 2018-07-12 ENCOUNTER — Ambulatory Visit: Payer: Self-pay | Attending: Family Medicine

## 2018-07-12 DIAGNOSIS — N644 Mastodynia: Secondary | ICD-10-CM

## 2018-07-17 ENCOUNTER — Ambulatory Visit: Payer: Medicaid Other

## 2018-07-17 ENCOUNTER — Ambulatory Visit
Admission: RE | Admit: 2018-07-17 | Discharge: 2018-07-17 | Disposition: A | Discharge status: Home / Self Care | Payer: No Typology Code available for payment source | Source: Ambulatory Visit | Attending: Physician Assistant | Admitting: Physician Assistant

## 2018-07-17 DIAGNOSIS — N644 Mastodynia: Secondary | ICD-10-CM

## 2018-07-22 ENCOUNTER — Ambulatory Visit: Payer: Medicaid Other

## 2018-07-25 ENCOUNTER — Ambulatory Visit: Payer: Self-pay | Attending: Family Medicine

## 2018-07-29 MED FILL — ?METFORMIN HCL 500MG TABL: 500 | 30 days supply | Qty: 30 | Fill #1

## 2018-07-29 MED FILL — ?ATORVASTATIN 20 MG TABLET: 20 | 30 days supply | Qty: 30 | Fill #1

## 2018-08-05 ENCOUNTER — Telehealth: Payer: Self-pay | Admitting: Family Medicine

## 2018-08-05 NOTE — Telephone Encounter (Signed)
Pt was called with an interpreter and informed her that we need the 401K from her husband she claim that will bring it by Friday the latest

## 2018-09-19 MED FILL — ?ATORVASTATIN 20 MG TABLET: 20 | 30 days supply | Qty: 30 | Fill #2

## 2018-09-19 MED FILL — ?METFORMIN HCL 500MG TABLET: 500 | 30 days supply | Qty: 30 | Fill #2

## 2018-10-03 ENCOUNTER — Ambulatory Visit: Payer: Self-pay | Attending: Family Medicine | Admitting: Family Medicine

## 2018-10-03 ENCOUNTER — Encounter: Payer: Self-pay | Admitting: Family Medicine

## 2018-10-03 VITALS — BP 108/70 | HR 70 | Temp 97.4°F | Ht 64.0 in | Wt 186.6 lb

## 2018-10-03 DIAGNOSIS — E7800 Pure hypercholesterolemia, unspecified: Secondary | ICD-10-CM

## 2018-10-03 DIAGNOSIS — R1031 Right lower quadrant pain: Secondary | ICD-10-CM

## 2018-10-03 DIAGNOSIS — Z7984 Long term (current) use of oral hypoglycemic drugs: Secondary | ICD-10-CM | POA: Insufficient documentation

## 2018-10-03 DIAGNOSIS — E119 Type 2 diabetes mellitus without complications: Secondary | ICD-10-CM

## 2018-10-03 DIAGNOSIS — Z79899 Other long term (current) drug therapy: Secondary | ICD-10-CM | POA: Insufficient documentation

## 2018-10-03 DIAGNOSIS — E785 Hyperlipidemia, unspecified: Secondary | ICD-10-CM | POA: Insufficient documentation

## 2018-10-03 LAB — POCT GLYCOSYLATED HEMOGLOBIN (HGB A1C): HBA1C, POC (CONTROLLED DIABETIC RANGE): 6.2 % (ref 0.0–7.0)

## 2018-10-03 LAB — GLUCOSE, POCT (MANUAL RESULT ENTRY): POC GLUCOSE: 98 mg/dL (ref 70–99)

## 2018-10-03 MED ORDER — ATORVASTATIN CALCIUM 20 MG PO TABS: 20.0000 mg | ORAL_TABLET | Freq: Every day | ORAL | 6 refills | Status: DC

## 2018-10-03 MED ORDER — METFORMIN HCL 500 MG PO TABS: 500.0000 mg | ORAL_TABLET | Freq: Every day | ORAL | 6 refills | Status: DC

## 2018-10-03 NOTE — Progress Notes (Signed)
Subjective:  Patient ID: Penny Barber, female    DOB: [birth year]  Age: 32 y.o. MRN: [id number]  CC: Diabetes   HPI Terriyah Dezirae Service  is a 32 year old female with a history of type 2 diabetes mellitus (A1c 6.2) hyperlipidemia who presents today for follow-up visit. She has been compliant with metformin and her fasting sugars have ranged between 118 and 124 and she denies hypoglycemia, numbness in extremities, visual concerns.  She is yet to undergo her annual eye exam because she keeps forgetting and at this time she has no medical coverage which precludes her from being referred to an ophthalmologist. She endorses some intermittent right lower quadrant abdominal pain which is mild and worse when she lies on her right side but absent when she lies on her left side.  Denies constipation, nausea, vomiting, abdominal bloating, hematochezia or hematemesis. Denies the presence of myalgias or adverse effects from her statin or metformin.  Past Medical History:  Diagnosis Date  . Anxiety    when heart skips a beat  . Diabetes mellitus without complication (HCC)   . Medical history non-contributory   . Varicose veins     Past Surgical History:  Procedure Laterality Date  . CESAREAN SECTION    . CESAREAN SECTION N/A 09/10/2014   Procedure: CESAREAN SECTION;  Surgeon: Adam Phenix, MD;  Location: [facility];  Service: Obstetrics;  Laterality: N/A;    No Known Allergies   Outpatient Medications Prior to Visit  Medication Sig Dispense Refill  . atorvastatin (LIPITOR) 20 MG tablet Take 1 tablet (20 mg total) by mouth daily. 30 tablet 6  . metFORMIN (GLUCOPHAGE) 500 MG tablet Take 1 tablet (500 mg total) by mouth daily with breakfast. 30 tablet 6  . naproxen (NAPROSYN) 500 MG tablet Take 1 tablet (500 mg total) by mouth 2 (two) times daily with a meal. 60 tablet 0  . valACYclovir (VALTREX) 1000 MG tablet Take 1 tablet (1,000 mg total) by mouth 2 (two) times daily. 21 tablet 0  .  traMADol (ULTRAM) 50 MG tablet Take 1 tablet (50 mg total) by mouth every 8 (eight) hours as needed. (Patient not taking: Reported on 10/03/2018) 30 tablet 0   No facility-administered medications prior to visit.     ROS Review of Systems  Constitutional: Negative for activity change, appetite change and fatigue.  HENT: Negative for congestion, sinus pressure and sore throat.   Eyes: Negative for visual disturbance.  Respiratory: Negative for cough, chest tightness, shortness of breath and wheezing.   Cardiovascular: Negative for chest pain and palpitations.  Gastrointestinal: Negative for abdominal distention, abdominal pain and constipation.  Endocrine: Negative for polydipsia.  Genitourinary: Negative for dysuria and frequency.  Musculoskeletal: Negative for arthralgias and back pain.  Skin: Negative for rash.  Neurological: Negative for tremors, light-headedness and numbness.  Hematological: Does not bruise/bleed easily.  Psychiatric/Behavioral: Negative for agitation and behavioral problems.    Objective:  BP 108/70   Pulse 70   Temp (!) 97.4 F (36.3 C) (Oral)   Ht 5\' 4"  (1.626 m)   Wt 186 lb 9.6 oz (84.6 kg)   SpO2 100%   BMI 32.03 kg/m   BP/Weight 10/03/2018 07/04/2018 05/20/2018  Systolic BP 108 113 119  Diastolic BP 70 79 79  Wt. (Lbs) 186.6 189 188.6  BMI 32.03 32.44 31.42      Physical Exam Constitutional:      Appearance: She is well-developed.  Cardiovascular:     Rate and Rhythm: Normal rate.  Heart sounds: Normal heart sounds. No murmur.  Pulmonary:     Effort: Pulmonary effort is normal.     Breath sounds: Normal breath sounds. No wheezing or rales.  Chest:     Chest wall: No tenderness.  Abdominal:     General: Bowel sounds are normal. There is no distension.     Palpations: Abdomen is soft. There is no mass.     Tenderness: There is no abdominal tenderness.  Musculoskeletal: Normal range of motion.  Neurological:     Mental Status: She is  alert and oriented to person, place, and time.  Psychiatric:        Mood and Affect: Mood normal.        Behavior: Behavior normal.     CMP Latest Ref Rng & Units 05/20/2018 11/13/2017 07/03/2014  Glucose 65 - 99 mg/dL 119(J114(H) 478(G116(H) 92  BUN 6 - 20 mg/dL 11 17 -  Creatinine 9.560.57 - 1.00 mg/dL 2.130.73 0.860.72 -  Sodium 578134 - 144 mmol/L 138 138 -  Potassium 3.5 - 5.2 mmol/L 3.7 3.8 -  Chloride 96 - 106 mmol/L 104 100 -  CO2 20 - 29 mmol/L 21 19(L) -  Calcium 8.7 - 10.2 mg/dL 8.8 9.2 -  Total Protein 6.0 - 8.5 g/dL 6.7 7.3 -  Total Bilirubin 0.0 - 1.2 mg/dL 0.6 0.6 -  Alkaline Phos 39 - 117 IU/L 70 83 -  AST 0 - 40 IU/L 19 18 -  ALT 0 - 32 IU/L 12 15 -    Lipid Panel     Component Value Date/Time   CHOL 184 11/13/2017 0932   TRIG 94 11/13/2017 0932   HDL 48 11/13/2017 0932   CHOLHDL 3.8 11/13/2017 0932   CHOLHDL 2.9 10/08/2013 1142   VLDL 17 10/08/2013 1142   LDLCALC 117 (H) 11/13/2017 0932    Lab Results  Component Value Date   HGBA1C 6.2 10/03/2018     Assessment & Plan:   1. Type 2 diabetes mellitus without complication, without long-term current use of insulin (HCC) Controlled with A1c of 6.2 Continue current regimen Counseled on Diabetic diet, my plate method, 469150 minutes of moderate intensity exercise/week Keep blood sugar logs with fasting goals of 80-120 mg/dl, random of less than 629180 and in the event of sugars less than 60 mg/dl or greater than 528400 mg/dl please notify the clinic ASAP. It is recommended that you undergo annual eye exams and annual foot exams. Pneumonia vaccine is recommended. - POCT glucose (manual entry) - POCT glycosylated hemoglobin (Hb A1C) - metFORMIN (GLUCOPHAGE) 500 MG tablet; Take 1 tablet (500 mg total) by mouth daily with breakfast.  Dispense: 30 tablet; Refill: 6  2. Pure hypercholesterolemia Controlled Low-cholesterol diet - atorvastatin (LIPITOR) 20 MG tablet; Take 1 tablet (20 mg total) by mouth daily.  Dispense: 30 tablet; Refill:  6  3. Right lower quadrant abdominal pain Absent at this time Goal is to prevent constipation; advised to use OTC MiraLAX Increase fiber intake, water intake   Meds ordered this encounter  Medications  . atorvastatin (LIPITOR) 20 MG tablet    Sig: Take 1 tablet (20 mg total) by mouth daily.    Dispense:  30 tablet    Refill:  6  . metFORMIN (GLUCOPHAGE) 500 MG tablet    Sig: Take 1 tablet (500 mg total) by mouth daily with breakfast.    Dispense:  30 tablet    Refill:  6    Follow-up: Return in about 6 months (around  04/04/2019) for Follow-up of chronic medical conditions.   Hoy RegisterEnobong Jarquez Mestre MD

## 2018-10-03 NOTE — Patient Instructions (Signed)

## 2018-11-08 MED FILL — ?ATORVASTATIN 20 MG TABLET: 20 | 30 days supply | Qty: 30 | Fill #3

## 2018-11-08 MED FILL — ?METFORMIN HCL 500MG TABLET: 500 | 30 days supply | Qty: 30 | Fill #3

## 2018-12-19 MED FILL — ?METFORMIN HCL 500MG TABL: 500 | 30 days supply | Qty: 30 | Fill #4

## 2018-12-19 MED FILL — ?ATORVASTATIN 20 MG TABLET: 20 | 30 days supply | Qty: 30 | Fill #4

## 2019-02-13 ENCOUNTER — Other Ambulatory Visit: Payer: Self-pay

## 2019-02-13 ENCOUNTER — Ambulatory Visit: Payer: Self-pay | Attending: Family Medicine | Admitting: Physician Assistant

## 2019-02-13 DIAGNOSIS — J020 Streptococcal pharyngitis: Secondary | ICD-10-CM

## 2019-02-13 DIAGNOSIS — Z603 Acculturation difficulty: Secondary | ICD-10-CM

## 2019-02-13 DIAGNOSIS — E119 Type 2 diabetes mellitus without complications: Secondary | ICD-10-CM

## 2019-02-13 DIAGNOSIS — E7800 Pure hypercholesterolemia, unspecified: Secondary | ICD-10-CM

## 2019-02-13 MED ORDER — PENICILLIN V POTASSIUM 500 MG PO TABS: 500.0000 mg | ORAL_TABLET | Freq: Three times a day (TID) | ORAL | 0 refills | Status: AC

## 2019-02-13 MED ORDER — FLUCONAZOLE 150 MG PO TABS: 150.0000 mg | ORAL_TABLET | Freq: Once | ORAL | 0 refills | Status: AC

## 2019-02-13 MED ORDER — METFORMIN HCL 500 MG PO TABS: 500.0000 mg | ORAL_TABLET | Freq: Every day | ORAL | 6 refills | Status: AC

## 2019-02-13 MED ORDER — ATORVASTATIN CALCIUM 20 MG PO TABS: 20.0000 mg | ORAL_TABLET | Freq: Every day | ORAL | 6 refills | Status: AC

## 2019-02-13 NOTE — Progress Notes (Signed)
Patient ID: Penny Barber, female   DOB: Jan 04, 1986, 33 y.o.   MRN: [id number] Virtual Visit via Telephone Note  I connected with Penny Barber on 02/13/19 at  9:30 AM EDT by telephone and verified that I am speaking with the correct person using two identifiers.   I discussed the limitations, risks, security and privacy concerns of performing an evaluation and management service by telephone and the availability of in person appointments. I also discussed with the patient that there may be a patient responsible charge related to this service. The patient expressed understanding and agreed to proceed.  Patient location:  home My Location:  [facility] office Persons on the call:  Myself and the patient and the interpreter-Penny Barber   History of Present Illness: Went to ED twice at [facility] and 5/5).  Was diagnosed with strep throat and prescribed clindamycin but never picked it up.  She has had ST and fever with body aches and nausea for about 9 days.  No vomiting.    Blood sugars running about 150.  Needs med RF [name] is interpreter.      Observations/Objective:  TP linear, speech is clear   Assessment and Plan: 1. Strep pharyngitis Fluids, rest, salt water gargles - penicillin v potassium (VEETID) 500 MG tablet; Take 1 tablet (500 mg total) by mouth 3 (three) times daily.  Dispense: 30 tablet; Refill: 0 - fluconazole (DIFLUCAN) 150 MG tablet; Take 1 tablet (150 mg total) by mouth once for 1 dose.  Dispense: 1 tablet; Refill: 0  2. Type 2 diabetes mellitus without complication, without long-term current use of insulin (HCC) stable - metFORMIN (GLUCOPHAGE) 500 MG tablet; Take 1 tablet (500 mg total) by mouth daily with breakfast.  Dispense: 30 tablet; Refill: 6  3. Pure hypercholesterolemia - atorvastatin (LIPITOR) 20 MG tablet; Take 1 tablet (20 mg total) by mouth daily.  Dispense: 30 tablet; Refill: 6  4. Language barrier, cultural differences stratus interpreters  used and additional time performing visit was required.     Follow Up Instructions: 04/15/2019 with PCP   I discussed the assessment and treatment plan with the patient. The patient was provided an opportunity to ask questions and all were answered. The patient agreed with the plan and demonstrated an understanding of the instructions.   The patient was advised to call back or seek an in-person evaluation if the symptoms worsen or if the condition fails to improve as anticipated.  I provided 15 minutes of non-face-to-face time during this encounter.   Georgian Co, PA-C

## 2019-02-13 NOTE — Progress Notes (Signed)
Called patient to initiate their telephone visit with provider Georgian Co, PA. Verified date of birth. Patient has c/o sore throat, stomach pain & loss of appetite. No improvement from her ED visits earlier this week. KWalker, CMA.

## 2019-04-15 ENCOUNTER — Ambulatory Visit: Payer: Medicaid Other | Admitting: Family Medicine

## 2022-01-25 ENCOUNTER — Encounter (HOSPITAL_BASED_OUTPATIENT_CLINIC_OR_DEPARTMENT_OTHER): Payer: Self-pay

## 2022-01-25 ENCOUNTER — Emergency Department (HOSPITAL_BASED_OUTPATIENT_CLINIC_OR_DEPARTMENT_OTHER): Payer: BLUE CROSS/BLUE SHIELD

## 2022-01-25 ENCOUNTER — Other Ambulatory Visit: Payer: Self-pay

## 2022-01-25 ENCOUNTER — Emergency Department (HOSPITAL_BASED_OUTPATIENT_CLINIC_OR_DEPARTMENT_OTHER)
Admission: EM | Admit: 2022-01-25 | Discharge: 2022-01-25 | Disposition: A | Discharge status: Home / Self Care | Payer: BLUE CROSS/BLUE SHIELD | Attending: Emergency Medicine | Admitting: Emergency Medicine

## 2022-01-25 DIAGNOSIS — I10 Essential (primary) hypertension: Secondary | ICD-10-CM | POA: Diagnosis not present

## 2022-01-25 DIAGNOSIS — M25511 Pain in right shoulder: Secondary | ICD-10-CM | POA: Diagnosis not present

## 2022-01-25 DIAGNOSIS — Y92410 Unspecified street and highway as the place of occurrence of the external cause: Secondary | ICD-10-CM | POA: Insufficient documentation

## 2022-01-25 DIAGNOSIS — R519 Headache, unspecified: Secondary | ICD-10-CM | POA: Diagnosis not present

## 2022-01-25 DIAGNOSIS — M25521 Pain in right elbow: Secondary | ICD-10-CM | POA: Insufficient documentation

## 2022-01-25 DIAGNOSIS — E119 Type 2 diabetes mellitus without complications: Secondary | ICD-10-CM | POA: Insufficient documentation

## 2022-01-25 DIAGNOSIS — Z79899 Other long term (current) drug therapy: Secondary | ICD-10-CM | POA: Diagnosis not present

## 2022-01-25 DIAGNOSIS — M542 Cervicalgia: Secondary | ICD-10-CM | POA: Insufficient documentation

## 2022-01-25 DIAGNOSIS — Z7984 Long term (current) use of oral hypoglycemic drugs: Secondary | ICD-10-CM | POA: Diagnosis not present

## 2022-01-25 MED ORDER — IBUPROFEN 600 MG PO TABS: 600.0000 mg | ORAL_TABLET | Freq: Four times a day (QID) | ORAL | 0 refills | Status: AC | PRN

## 2022-01-25 MED ORDER — IBUPROFEN 600 MG PO TABS
600.0000 mg | ORAL_TABLET | Freq: Four times a day (QID) | ORAL | 0 refills | Status: DC | PRN
  Filled 2022-01-25: qty 20, 5d supply, fill #0

## 2022-01-25 MED ORDER — METHOCARBAMOL 500 MG PO TABS
500.0000 mg | ORAL_TABLET | Freq: Once | ORAL | Status: AC
  Administered 2022-01-25: 500 mg via ORAL
  Filled 2022-01-25: qty 1

## 2022-01-25 MED ORDER — METHOCARBAMOL 500 MG PO TABS
500.0000 mg | ORAL_TABLET | Freq: Four times a day (QID) | ORAL | 0 refills | Status: DC
  Filled 2022-01-25: qty 20, 5d supply, fill #0

## 2022-01-25 MED ORDER — METHOCARBAMOL 500 MG PO TABS: 500.0000 mg | ORAL_TABLET | Freq: Four times a day (QID) | ORAL | 0 refills | Status: AC

## 2022-01-25 MED ORDER — ACETAMINOPHEN 325 MG PO TABS
650.0000 mg | ORAL_TABLET | Freq: Once | ORAL | Status: AC
  Administered 2022-01-25: 650 mg via ORAL
  Filled 2022-01-25: qty 2
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
## 2022-01-25 NOTE — ED Triage Notes (Signed)
PMH diabetes and high cholesterol, NKDA ?
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
## 2022-03-03 ENCOUNTER — Other Ambulatory Visit: Payer: Self-pay

## 2022-03-03 ENCOUNTER — Emergency Department (HOSPITAL_BASED_OUTPATIENT_CLINIC_OR_DEPARTMENT_OTHER)
Admission: EM | Admit: 2022-03-03 | Discharge: 2022-03-03 | Disposition: A | Discharge status: Home / Self Care | Payer: BLUE CROSS/BLUE SHIELD | Attending: Emergency Medicine | Admitting: Emergency Medicine

## 2022-03-03 ENCOUNTER — Encounter (HOSPITAL_BASED_OUTPATIENT_CLINIC_OR_DEPARTMENT_OTHER): Payer: Self-pay | Admitting: Emergency Medicine

## 2022-03-03 DIAGNOSIS — Y92410 Unspecified street and highway as the place of occurrence of the external cause: Secondary | ICD-10-CM | POA: Diagnosis not present

## 2022-03-03 DIAGNOSIS — M25512 Pain in left shoulder: Secondary | ICD-10-CM | POA: Diagnosis not present

## 2022-03-03 DIAGNOSIS — R519 Headache, unspecified: Secondary | ICD-10-CM | POA: Insufficient documentation

## 2022-03-03 DIAGNOSIS — M25511 Pain in right shoulder: Secondary | ICD-10-CM | POA: Diagnosis not present

## 2022-03-03 DIAGNOSIS — M542 Cervicalgia: Secondary | ICD-10-CM | POA: Insufficient documentation

## 2022-03-03 LAB — BASIC METABOLIC PANEL
Anion gap: 8 (ref 5–15)
BUN: 18 mg/dL (ref 6–20)
CO2: 24 mmol/L (ref 22–32)
Calcium: 9.1 mg/dL (ref 8.9–10.3)
Chloride: 103 mmol/L (ref 98–111)
Creatinine, Ser: 0.8 mg/dL (ref 0.44–1.00)
GFR, Estimated: >60 mL/min (ref >60)
Glucose, Bld: 169 mg/dL — ABNORMAL HIGH (ref 70–99)
Potassium: 3.5 mmol/L (ref 3.5–5.1)
Sodium: 135 mmol/L (ref 135–145)

## 2022-03-03 LAB — PREGNANCY, URINE: Preg Test, Ur: NEGATIVE

## 2022-03-03 MED ORDER — DIPHENHYDRAMINE HCL 25 MG PO CAPS
25.0000 mg | ORAL_CAPSULE | Freq: Once | ORAL | Status: AC
  Administered 2022-03-03: 25 mg via ORAL
  Filled 2022-03-03: qty 1

## 2022-03-03 MED ORDER — KETOROLAC TROMETHAMINE 15 MG/ML IJ SOLN
15.0000 mg | Freq: Once | INTRAMUSCULAR | Status: AC
  Administered 2022-03-03: 15 mg via INTRAVENOUS
  Filled 2022-03-03: qty 1

## 2022-03-03 MED ORDER — METOCLOPRAMIDE HCL 10 MG PO TABS: 10.0000 mg | ORAL_TABLET | Freq: Once | ORAL | Status: DC

## 2022-03-03 MED ORDER — IBUPROFEN 800 MG PO TABS: 800.0000 mg | ORAL_TABLET | Freq: Once | ORAL | Status: DC

## 2022-03-03 MED ORDER — METOCLOPRAMIDE HCL 5 MG/ML IJ SOLN
10.0000 mg | Freq: Once | INTRAMUSCULAR | Status: AC
  Administered 2022-03-03: 10 mg via INTRAVENOUS
  Filled 2022-03-03: qty 2
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
## 2022-03-03 NOTE — ED Provider Notes (Incomplete)
  [facility] EMERGENCY DEPARTMENT Provider Note   CSN: [id number] Arrival date & time: 03/03/22  1952     History {Add pertinent medical, surgical, social history, OB history to HPI:1} Chief Complaint  Patient presents with  . Headache    Penny Barber is a 36 y.o. female.   Headache     Home Medications Prior to Admission medications   Medication Sig Start Date End Date Taking? Authorizing Provider  atorvastatin (LIPITOR) 20 MG tablet Take 1 tablet (20 mg total) by mouth daily. 02/13/19   Anders Simmonds, PA-C  ibuprofen (ADVIL) 600 MG tablet Take 1 tablet (600 mg total) by mouth every 6 (six) hours as needed. 01/25/22   Renne Crigler, PA-C  metFORMIN (GLUCOPHAGE) 500 MG tablet Take 1 tablet (500 mg total) by mouth daily with breakfast. 02/13/19   Anders Simmonds, PA-C  methocarbamol (ROBAXIN) 500 MG tablet Take 1 tablet (500 mg total) by mouth 4 (four) times daily. 01/25/22   Renne Crigler, PA-C  penicillin v potassium (VEETID) 500 MG tablet Take 1 tablet (500 mg total) by mouth 3 (three) times daily. 02/13/19   Anders Simmonds, PA-C      Allergies    Patient has no known allergies.    Review of Systems   Review of Systems  Neurological:  Positive for headaches.   Physical Exam Updated Vital Signs BP 99/60   Pulse 73   Temp 97.8 F (36.6 C) (Oral)   Resp 16   Ht 5\' 3"  (1.6 m)   Wt 81.6 kg   LMP 02/26/2022   SpO2 99%   BMI 31.89 kg/m  Physical Exam  ED Results / Procedures / Treatments   Labs (all labs ordered are listed, but only abnormal results are displayed) Labs Reviewed  BASIC METABOLIC PANEL - Abnormal; Notable for the following components:      Result Value   Glucose, Bld 169 (*)    All other components within normal limits  PREGNANCY, URINE    EKG None  Radiology No results found.  Procedures Procedures  {Document cardiac monitor, telemetry assessment procedure when appropriate:1}  Medications Ordered in  ED Medications  diphenhydrAMINE (BENADRYL) capsule 25 mg (25 mg Oral Given 03/03/22 2111)  metoCLOPramide (REGLAN) injection 10 mg (10 mg Intravenous Given 03/03/22 2111)  ketorolac (TORADOL) 15 MG/ML injection 15 mg (15 mg Intravenous Given 03/03/22 2110)    ED Course/ Medical Decision Making/ A&P                           Medical Decision Making Amount and/or Complexity of Data Reviewed Labs: ordered.  Risk Prescription drug management.   ***  {Document critical care time when appropriate:1} {Document review of labs and clinical decision tools ie heart score, Chads2Vasc2 etc:1}  {Document your independent review of radiology images, and any outside records:1} {Document your discussion with family members, caretakers, and with consultants:1} {Document social determinants of health affecting pt's care:1} {Document your decision making why or why not admission, treatments were needed:1} Final Clinical Impression(s) / ED Diagnoses Final diagnoses:  None    Rx / DC Orders ED Discharge Orders     None

## 2022-03-03 NOTE — Discharge Instructions (Signed)
Headache instructions:  1. Medications: usual home medications 2. Treatment: rest, drink plenty of fluids, if headache persists take ibuprofen with caffeine.  3. Follow Up: Please followup with your primary doctor in 3 days and neurology within 1 week for discussion of your diagnoses and further evaluation after today's visit; if you do not have a primary care doctor use the resource guide provided to find one. Please return immediately to the ER if you experience new eye pain, double vision or other vision changes, changes in speech or ability to walk, persistent vomiting, neck stiffness, fever, loss of consciousness, or other new or concerning symptom.

## 2022-03-03 NOTE — ED Notes (Signed)
Pt ambulatory with steady gait to the restroom to provide urine sample.

## 2022-03-07 ENCOUNTER — Other Ambulatory Visit: Payer: Self-pay

## 2022-03-07 MED ORDER — NAPROXEN 500 MG PO TABS
ORAL_TABLET | ORAL | 0 refills | Status: AC
  Filled 2022-03-07: qty 30, 15d supply, fill #0

## 2022-03-14 ENCOUNTER — Other Ambulatory Visit: Payer: Self-pay
